# Patient Record
Sex: Female | Born: 1985 | Race: Black or African American | Hispanic: No | Marital: Married | Smoking: Never smoker
Health system: Southern US, Community
[De-identification: ages and names within clinical notes are randomized; demographics above are authoritative.]

## PROBLEM LIST (undated history)

## (undated) ENCOUNTER — Inpatient Hospital Stay: Payer: Self-pay

## (undated) DIAGNOSIS — K219 Gastro-esophageal reflux disease without esophagitis: Secondary | ICD-10-CM

## (undated) DIAGNOSIS — R1310 Dysphagia, unspecified: Secondary | ICD-10-CM

## (undated) DIAGNOSIS — D649 Anemia, unspecified: Secondary | ICD-10-CM

## (undated) DIAGNOSIS — M1990 Unspecified osteoarthritis, unspecified site: Secondary | ICD-10-CM

## (undated) DIAGNOSIS — R269 Unspecified abnormalities of gait and mobility: Secondary | ICD-10-CM

## (undated) DIAGNOSIS — G4730 Sleep apnea, unspecified: Secondary | ICD-10-CM

## (undated) DIAGNOSIS — Q6589 Other specified congenital deformities of hip: Secondary | ICD-10-CM

## (undated) DIAGNOSIS — R51 Headache: Secondary | ICD-10-CM

## (undated) DIAGNOSIS — J45909 Unspecified asthma, uncomplicated: Secondary | ICD-10-CM

## (undated) DIAGNOSIS — F329 Major depressive disorder, single episode, unspecified: Secondary | ICD-10-CM

## (undated) DIAGNOSIS — F4310 Post-traumatic stress disorder, unspecified: Secondary | ICD-10-CM

## (undated) DIAGNOSIS — E039 Hypothyroidism, unspecified: Secondary | ICD-10-CM

## (undated) DIAGNOSIS — J309 Allergic rhinitis, unspecified: Secondary | ICD-10-CM

## (undated) DIAGNOSIS — G809 Cerebral palsy, unspecified: Secondary | ICD-10-CM

## (undated) DIAGNOSIS — F32A Depression, unspecified: Secondary | ICD-10-CM

## (undated) HISTORY — DX: Unspecified osteoarthritis, unspecified site: M19.90

## (undated) HISTORY — DX: Other specified congenital deformities of hip: Q65.89

## (undated) HISTORY — DX: Allergic rhinitis, unspecified: J30.9

## (undated) HISTORY — DX: Dysphagia, unspecified: R13.10

## (undated) HISTORY — PX: HERNIA REPAIR: SHX51

## (undated) HISTORY — PX: FOOT SURGERY: SHX648

## (undated) HISTORY — PX: TONSILLECTOMY: SUR1361

## (undated) HISTORY — PX: JOINT REPLACEMENT: SHX530

## (undated) HISTORY — DX: Unspecified abnormalities of gait and mobility: R26.9

## (undated) HISTORY — DX: Depression, unspecified: F32.A

---
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
## 2010-04-29 ENCOUNTER — Ambulatory Visit: Payer: Self-pay | Admitting: Physician Assistant

## 2010-05-09 ENCOUNTER — Encounter: Payer: Self-pay | Admitting: Otolaryngology

## 2010-05-27 ENCOUNTER — Encounter: Payer: Self-pay | Admitting: Otolaryngology

## 2010-12-02 ENCOUNTER — Ambulatory Visit: Payer: Self-pay | Admitting: Gastroenterology

## 2010-12-02 HISTORY — PX: UPPER GI ENDOSCOPY: SHX6162

## 2012-06-30 ENCOUNTER — Ambulatory Visit: Payer: Self-pay | Admitting: Internal Medicine

## 2013-03-22 ENCOUNTER — Emergency Department: Payer: Self-pay | Admitting: Emergency Medicine

## 2013-03-31 ENCOUNTER — Ambulatory Visit: Payer: Self-pay | Admitting: Orthopedic Surgery

## 2013-04-12 ENCOUNTER — Ambulatory Visit: Payer: Self-pay | Admitting: Orthopedic Surgery

## 2013-04-12 LAB — PREGNANCY, URINE: Pregnancy Test, Urine: NEGATIVE m[IU]/mL

## 2013-04-16 LAB — BODY FLUID CULTURE

## 2013-05-29 ENCOUNTER — Ambulatory Visit: Payer: Self-pay | Admitting: Orthopedic Surgery

## 2013-07-31 ENCOUNTER — Ambulatory Visit: Payer: Self-pay | Admitting: Otolaryngology

## 2013-09-08 ENCOUNTER — Other Ambulatory Visit (HOSPITAL_COMMUNITY): Payer: Self-pay | Admitting: Orthopedic Surgery

## 2013-09-08 DIAGNOSIS — M25559 Pain in unspecified hip: Secondary | ICD-10-CM

## 2013-09-16 ENCOUNTER — Emergency Department (HOSPITAL_COMMUNITY): Payer: Medicaid Other

## 2013-09-16 ENCOUNTER — Emergency Department (HOSPITAL_COMMUNITY)
Admission: EM | Admit: 2013-09-16 | Discharge: 2013-09-16 | Disposition: A | Discharge status: Home / Self Care | Payer: Medicaid Other | Attending: Emergency Medicine | Admitting: Emergency Medicine

## 2013-09-16 ENCOUNTER — Encounter (HOSPITAL_COMMUNITY): Payer: Self-pay | Admitting: Emergency Medicine

## 2013-09-16 DIAGNOSIS — J45909 Unspecified asthma, uncomplicated: Secondary | ICD-10-CM | POA: Insufficient documentation

## 2013-09-16 DIAGNOSIS — G8929 Other chronic pain: Secondary | ICD-10-CM | POA: Insufficient documentation

## 2013-09-16 DIAGNOSIS — M25559 Pain in unspecified hip: Secondary | ICD-10-CM | POA: Insufficient documentation

## 2013-09-16 DIAGNOSIS — Z8669 Personal history of other diseases of the nervous system and sense organs: Secondary | ICD-10-CM | POA: Insufficient documentation

## 2013-09-16 DIAGNOSIS — R1310 Dysphagia, unspecified: Secondary | ICD-10-CM | POA: Insufficient documentation

## 2013-09-16 HISTORY — DX: Unspecified asthma, uncomplicated: J45.909

## 2013-09-16 HISTORY — DX: Cerebral palsy, unspecified: G80.9

## 2013-09-16 LAB — COMPREHENSIVE METABOLIC PANEL
ALK PHOS: 119 U/L — AB (ref 39–117)
ALT: 13 U/L (ref 0–35)
AST: 16 U/L (ref 0–37)
Albumin: 3.5 g/dL (ref 3.5–5.2)
BUN: 7 mg/dL (ref 6–23)
CO2: 23 meq/L (ref 19–32)
Calcium: 9.2 mg/dL (ref 8.4–10.5)
Chloride: 103 mEq/L (ref 96–112)
Creatinine, Ser: 0.68 mg/dL (ref 0.50–1.10)
GLUCOSE: 78 mg/dL (ref 70–99)
POTASSIUM: 3.7 meq/L (ref 3.7–5.3)
Sodium: 138 mEq/L (ref 137–147)
TOTAL PROTEIN: 7.3 g/dL (ref 6.0–8.3)
Total Bilirubin: 0.3 mg/dL (ref 0.3–1.2)

## 2013-09-16 LAB — CBC
HEMATOCRIT: 35.5 % — AB (ref 36.0–46.0)
Hemoglobin: 11.7 g/dL — ABNORMAL LOW (ref 12.0–15.0)
MCH: 27.7 pg (ref 26.0–34.0)
MCHC: 33 g/dL (ref 30.0–36.0)
MCV: 83.9 fL (ref 78.0–100.0)
Platelets: 264 10*3/uL (ref 150–400)
RBC: 4.23 MIL/uL (ref 3.87–5.11)
RDW: 12.5 % (ref 11.5–15.5)
WBC: 7.1 10*3/uL (ref 4.0–10.5)

## 2013-09-16 NOTE — ED Notes (Signed)
Phlebotomy unable to obtain labs, two phlebotomist attempted

## 2013-09-16 NOTE — Discharge Instructions (Signed)
Follow up with your primary care doctor early this coming week - call office Monday morning to arrange appointment - discuss plan for possible specialist referral.  You may also ask your doctor to recheck your thyroid function. For chronic hip pain, follow up with orthopedist in the next couple weeks.  Given recurrent issues w voice, and swallowing, follow up with neurologist in the next couple weeks-  Call office to arrange appointment. Return to ER if worse, new symptoms, persistent vomiting, trouble breathing, other concern.     Dysphagia Swallowing problems (dysphagia) occur when solids and liquids seem to stick in your throat on the way down to your stomach, or the food takes longer to get to the stomach. Other symptoms include regurgitating food, noises coming from the throat, chest discomfort with swallowing, and a feeling of fullness or the feeling of something being stuck in your throat when swallowing. When blockage in your throat is complete it may be associated with drooling. CAUSES  Problems with swallowing may occur because of problems with the muscles. The food cannot be propelled in the usual manner into your stomach. You may have ulcers, scar tissue, or inflammation in the tube down which food travels from your mouth to your stomach (esophagus), which blocks food from passing normally into the stomach. Causes of inflammation include:  Acid reflux from your stomach into your esophagus.  Infection.  Radiation treatment for cancer.  Medicines taken without enough fluids to wash them down into your stomach. You may have nerve problems that prevent signals from being sent to the muscles of your esophagus to contract and move your food down to your stomach. Globus pharyngeus is a relatively common problem in which there is a sense of an obstruction or difficulty in swallowing, without any physical abnormalities of the swallowing passages being found. This problem usually improves over  time with reassurance and testing to rule out other causes. DIAGNOSIS Dysphagia can be diagnosed and its cause can be determined by tests in which you swallow a white substance that helps illuminate the inside of your throat (contrast medium) while X-rays are taken. Sometimes a flexible telescope that is inserted down your throat (endoscopy) to look at your esophagus and stomach is used. TREATMENT   If the dysphagia is caused by acid reflux or infection, medicines may be used.  If the dysphagia is caused by problems with your swallowing muscles, swallowing therapy may be used to help you strengthen your swallowing muscles.  If the dysphagia is caused by a blockage or mass, procedures to remove the blockage may be done. HOME CARE INSTRUCTIONS  Try to eat soft food that is easier to swallow and check your weight on a daily basis to be sure that it is not decreasing.  Be sure to drink liquids when sitting upright (not lying down). SEEK MEDICAL CARE IF:  You are losing weight because you are unable to swallow.  You are coughing when you drink liquids (aspiration).  You are coughing up partially digested food. SEEK IMMEDIATE MEDICAL CARE IF:  You are unable to swallow your own saliva .  You are having shortness of breath or a fever, or both.  You have a hoarse voice along with difficulty swallowing. MAKE SURE YOU:  Understand these instructions.  Will watch your condition.  Will get help right away if you are not doing well or get worse. Document Released: 07/10/2000 Document Revised: 03/15/2013 Document Reviewed: 12/30/2012 [facility] Patient Information 2014 [place].

## 2013-09-16 NOTE — ED Provider Notes (Signed)
CSN: [id number]     Arrival date & time 09/16/13  1230 History   First MD Initiated Contact with Patient 09/16/13 1259     Chief Complaint  Patient presents with  . Dysphagia     (Consider location/radiation/quality/duration/timing/severity/associated sxs/prior Treatment) The history is provided by the patient and a friend.  pt indicates for the past several months has had issues w trouble swallowing. She is able to eat/drink, but states is hard to swallow.  No acute fb sensation today, but swallowing difficulty persists. States has seen pcp and gi for same without specific dx, had egd which was told was normal. Pt notes prior to that was having 'vocal cord problems' for several months, states was sent to specialist by pcp, but again not specific dx.  Denies facial or extremity numbness/weakness. No problems w gait (other than due to chronic hip pain x months/yrs, s/p hip surgery approx 10 yrs ago). Denies choking, or trouble breathing.   Pt states w above symptoms, no definite dx, states one of her doctors told her she may have CP.   Past Medical History  Diagnosis Date  . Asthma   . CP (cerebral palsy)    History reviewed. No pertinent past surgical history. History reviewed. No pertinent family history. History  Substance Use Topics  . Smoking status: Not on file  . Smokeless tobacco: Not on file  . Alcohol Use: No   OB History   Grav Para Term Preterm Abortions TAB SAB Ect Mult Living                 Review of Systems  Constitutional: Negative for fever and chills.  HENT: Negative for sore throat.   Eyes: Negative for redness.  Respiratory: Negative for choking and shortness of breath.   Cardiovascular: Negative for chest pain.  Gastrointestinal: Negative for vomiting, abdominal pain and diarrhea.  Genitourinary: Negative for flank pain.  Musculoskeletal: Negative for back pain and neck pain.  Skin: Negative for rash.  Neurological: Negative for headaches.   Hematological: Does not bruise/bleed easily.  Psychiatric/Behavioral: Negative for confusion.      Allergies  Review of patient's allergies indicates no known allergies.  Home Medications  No current outpatient prescriptions on file. BP 124/83  Pulse 112  SpO2 100% Physical Exam  Nursing note and vitals reviewed. Constitutional: She is oriented to person, place, and time. She appears well-developed and well-nourished. No distress.  Speech quiet - states baseline for pt.   HENT:  Head: Atraumatic.  Mouth/Throat: Oropharynx is clear and moist.  Eyes: Conjunctivae are normal. No scleral icterus.  Neck: Normal range of motion. Neck supple. No tracheal deviation present. No thyromegaly present.  No stiffness/rigidity. No mass.   Cardiovascular: Normal rate, regular rhythm, normal heart sounds and intact distal pulses.   Pulmonary/Chest: Effort normal and breath sounds normal. No respiratory distress.  Abdominal: Soft. Normal appearance and bowel sounds are normal. She exhibits no distension and no mass. There is no tenderness. There is no rebound and no guarding.  Musculoskeletal: She exhibits no edema.  Neurological: She is alert and oriented to person, place, and time.  Motor intact bil.   Skin: Skin is warm and dry. No rash noted.    ED Course  Procedures (including critical care time)  Results for orders placed during the hospital encounter of 09/16/13  CBC      Result Value Ref Range   WBC 7.1  4.0 - 10.5 K/uL   RBC 4.23  3.87 -  5.11 MIL/uL   Hemoglobin 11.7 (*) 12.0 - 15.0 g/dL   HCT 35.5 (*) 36.0 - 46.0 %   MCV 83.9  78.0 - 100.0 fL   MCH 27.7  26.0 - 34.0 pg   MCHC 33.0  30.0 - 36.0 g/dL   RDW 12.5  11.5 - 15.5 %   Platelets 264  150 - 400 K/uL  COMPREHENSIVE METABOLIC PANEL      Result Value Ref Range   Sodium 138  137 - 147 mEq/L   Potassium 3.7  3.7 - 5.3 mEq/L   Chloride 103  96 - 112 mEq/L   CO2 23  19 - 32 mEq/L   Glucose, Bld 78  70 - 99 mg/dL   BUN  7  6 - 23 mg/dL   Creatinine, Ser 0.68  0.50 - 1.10 mg/dL   Calcium 9.2  8.4 - 10.5 mg/dL   Total Protein 7.3  6.0 - 8.3 g/dL   Albumin 3.5  3.5 - 5.2 g/dL   AST 16  0 - 37 U/L   ALT 13  0 - 35 U/L   Alkaline Phosphatase 119 (*) 39 - 117 U/L   Total Bilirubin 0.3  0.3 - 1.2 mg/dL   GFR calc non Af Amer >90  >90 mL/min   GFR calc Af Amer >90  >90 mL/min   Dg Esophagus  09/16/2013   CLINICAL DATA:  Dysphagia.  Food sticking in the chest.  EXAM: ESOPHOGRAM  TECHNIQUE: Single contrast examination was performed using thin barium.  COMPARISON:  None.  FLUOROSCOPY TIME:  1 min 31 seconds  FINDINGS: Exam was somewhat limited by patient's cerebral palsy, with associated mental and physical limitations. No evidence of esophageal mass, stricture, or obstruction. No foreign body seen within the esophagus. No hiatal hernia identified. Esophageal motility is within normal limits. No gastroesophageal reflux visualized.  IMPRESSION: Negative.   Electronically Signed   By: Earle Gell M.D.   On: 09/16/2013 14:57      MDM   Labs. Trial po fluids.  Pt/sig other note prior workups for same at [facility] - sent for records.  Reviewed nursing notes and prior charts for additional history.   Unable to obtain egd report. Pt/signif other states no prior records at [facility], only at [facility].  [facility] records obtained, no egd, report of prior workup hip pain from 2014 neg for acute infxn/acute process.   esophagus study in ed negative.   Pt is eating/drinking in ED.  States pcp is Dr Lisbeth Ply - encourage to follow up with her early this week and coordinate specialty care follow up/plan.  Given hx 'vocal cord problems, and now prolonged dysphagia, will refer to neuro follow up as well.  Pt appears stable for d/c.     Mirna Mires, MD 09/16/13 1535

## 2013-09-16 NOTE — ED Notes (Signed)
Pt in from home via [facility], c/o difficulty swallowing onset x 1 mth, pt has had swallow study completed, hx CP, pt communicates with difficulty verbally per EMS this is baseline, pt lives independently, pt A&O x4, follows commands, speaks in complete sentence, uses walker at home, no excessive drooling present

## 2013-09-26 ENCOUNTER — Ambulatory Visit (HOSPITAL_COMMUNITY)
Admission: RE | Admit: 2013-09-26 | Discharge: 2013-09-26 | Disposition: A | Discharge status: Home / Self Care | Payer: Medicaid Other | Source: Ambulatory Visit | Attending: Orthopedic Surgery | Admitting: Orthopedic Surgery

## 2013-09-26 ENCOUNTER — Ambulatory Visit: Payer: Self-pay | Admitting: Family Medicine

## 2013-09-26 DIAGNOSIS — M25559 Pain in unspecified hip: Secondary | ICD-10-CM | POA: Insufficient documentation

## 2013-09-26 DIAGNOSIS — Z96649 Presence of unspecified artificial hip joint: Secondary | ICD-10-CM | POA: Insufficient documentation

## 2013-10-03 ENCOUNTER — Encounter: Payer: Self-pay | Admitting: Neurology

## 2013-10-03 ENCOUNTER — Ambulatory Visit (INDEPENDENT_AMBULATORY_CARE_PROVIDER_SITE_OTHER): Payer: Medicaid Other | Admitting: Neurology

## 2013-10-03 VITALS — BP 124/79 | HR 86 | Ht 67.0 in | Wt 195.0 lb

## 2013-10-03 DIAGNOSIS — M25559 Pain in unspecified hip: Secondary | ICD-10-CM

## 2013-10-03 DIAGNOSIS — M25552 Pain in left hip: Secondary | ICD-10-CM

## 2013-10-03 DIAGNOSIS — R1310 Dysphagia, unspecified: Secondary | ICD-10-CM | POA: Insufficient documentation

## 2013-10-03 DIAGNOSIS — G808 Other cerebral palsy: Secondary | ICD-10-CM

## 2013-10-03 DIAGNOSIS — R269 Unspecified abnormalities of gait and mobility: Secondary | ICD-10-CM | POA: Insufficient documentation

## 2013-10-03 NOTE — Progress Notes (Signed)
[facility] [address] [place]. [place] [address] [phone]       OFFICE CONSULT NOTE  Ms. Pamela Palmer Date of Birth:  [birth year] Medical Record Number:  [id number]   Referring MD:  Daiva Eves  Reason for Referral:  dysphagia  HPI: 28 year old African American lady with dysphagia since last 6 months. History is obtained from the patient and referral notes. She states that this is gradual onset and not progressive. She is able to initiate swallowing but feels that food gets stuck in her throat. She has had to chew food into small pieces in order to keep. She can eat soft foods only. She has noted decreased appetite and she's lost 10 pounds for the last 6 months. She also states that her voice seems to fluctuate at times it is deep and it is soft. She has notice increasing dysphagia particularly at night with occasional sharp and burning pain in her throat. She states she's had swallow eval done twice but review of records did not reveal any reports. On inquiry she admits to getting tired easily and having less stamina. She denies accompanying neurological symptoms in the form of droopy eyelids, diurnal fluctuation in her symptoms, muscle fatigue, diplopia, jaw dropping while eating or extremity weakness. She was seen in [facility] emergency room recently and had CBC and BMP which were normal. 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 She uses a walker for long distances and can walk short distances without help. She vent is present special needs classes and finished high school. She is on disability. 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 She denies any worsening of her baseline gait difficulties or speech problems along with thi dysphagia over the last 6 months.  ROS:   14 system review of systems is positive for weight loss,  chest pain,ringing in ears,trouble swallowing,itching,blurred vision,feeling cold, joint pain,aching muscles, difficulty swallowing,insonmia, snoring,not enough sleep,decreased energy, change in apetite  PMH:  Past Medical History  Diagnosis Date  . Asthma   . CP (cerebral palsy)   . Dysphagia   . Abnormality of gait     Social History:  History   Social History  . Marital Status: Significant Other    Spouse Name: N/A    Number of Children: 0  . Years of Education: 12th   Occupational History  . na    Social History Main Topics  . Smoking status: Never Smoker   . Smokeless tobacco: Not on file  . Alcohol Use: No  . Drug Use: No  . Sexual Activity: Not on file   Other Topics Concern  . Not on file   Social History Narrative  . No narrative on file    Medications:   Current Outpatient Prescriptions on File Prior to Visit  Medication Sig Dispense Refill  . albuterol (PROVENTIL HFA;VENTOLIN HFA) 108 (90 BASE) MCG/ACT inhaler Inhale 2 puffs into the lungs every 6 (six) hours as needed for wheezing or shortness of breath.      . cetirizine (ZYRTEC) 10 MG tablet Take 10 mg by mouth daily.      Marland Kitchen dexlansoprazole (DEXILANT) 60 MG capsule Take 60 mg by mouth daily.      Marland Kitchen levothyroxine (SYNTHROID, LEVOTHROID) 50 MCG tablet Take 50 mcg by mouth daily before breakfast.      . montelukast (SINGULAIR) 10  MG tablet Take 10 mg by mouth daily.      . nabumetone (RELAFEN) 500 MG tablet Take 500 mg by mouth 2 (two) times daily.      Marland Kitchen omeprazole (PRILOSEC) 40 MG capsule Take 40 mg by mouth daily.      Marland Kitchen zolpidem (AMBIEN) 5 MG tablet Take 5 mg by mouth at bedtime as needed for sleep.       No current facility-administered medications on file prior to visit.    Allergies:  No Known Allergies  Physical Exam General: well developed, well nourished, seated, in no evident distress Head: head normocephalic and atraumatic. Orohparynx benign Neck: supple with no carotid or  supraclavicular bruits Cardiovascular: regular rate and rhythm, no murmurs Musculoskeletal: no deformity Skin:  no rash/petichiae Vascular:  Normal pulses all extremities Filed Vitals:   10/03/13 1255  BP: 124/79  Pulse: 86    Neurologic Exam Mental Status: Awake and fully alert. Oriented to place and time. Recent and remote memory intact. Attention span, concentration and fund of knowledge appropriate. Mood and affect appropriate.  Cranial Nerves: Fundoscopic exam reveals sharp disc margins. Pupils equal, briskly reactive to light. Extraocular movements full without nystagmus.Mild drooping of both eyelids left more than right Visual fields full to confrontation. Hearing intact. Facial sensation intact. Face, tongue, palate moves normally and symmetrically.  Motor: Normal bulk and tone. Normal strength in all tested extremity muscles. Except mild left hip weakness due to pain Sensory.: intact to touch and pinprick and vibratory.  Coordination: Rapid alternating movements normal in all extremities. Finger-to-nose and heel-to-shin performed accurately bilaterally. Gait and Station: Arises from chair with  difficulty. Stance is normal. Gait is antalgic with favoring left hip Reflexes:3++ and asymmetric left more than right. Toes downgoing.     ASSESSMENT: 28 year african [place] lady with  Isolated subacute dysphagia of unclear etiology-possibilities  To be considered include bulbar myasthenia versus demyelinating disease or brainstem structural lesion.Marland Kitchen psychogenic dysphagia is a possibility after above organic causes have been ruled out    PLAN: I had a long discussion the patient with regards to dysphagia and long-standing gait abnormality, onset questions and discussed plan for evaluation. I recommend checking brain MRI scan and cervical spine rule out structural lesions as well as checking acetylcholine receptor antibodies and EMG for myasthenia. Also check ANA, ESR, B12 and TSH. Refer  to speech therapy for this pager evaluation. Return for followup in 2 months or call earlier if necessary.   Note: This document was prepared with digital dictation and possible smart phrase technology. Any transcriptional errors that result from this process are unintentional.

## 2013-10-03 NOTE — Patient Instructions (Signed)
I had a long discussion the patient with regards to dysphagia and long-standing gait abnormality, onset questions and discussed plan for evaluation. I recommend checking brain MRI scan and cervical spine rule out structural lesions as well as checking acetylcholine receptor antibodies and EMG for myasthenia. Also check ANA, ESR, B12 and TSH. Refer to speech therapy for this pager evaluation. Return for followup in 2 months or call earlier if necessary.  Dysphagia Swallowing problems (dysphagia) occur when solids and liquids seem to stick in your throat on the way down to your stomach, or the food takes longer to get to the stomach. Other symptoms include regurgitating food, noises coming from the throat, chest discomfort with swallowing, and a feeling of fullness or the feeling of something being stuck in your throat when swallowing. When blockage in your throat is complete it may be associated with drooling. CAUSES  Problems with swallowing may occur because of problems with the muscles. The food cannot be propelled in the usual manner into your stomach. You may have ulcers, scar tissue, or inflammation in the tube down which food travels from your mouth to your stomach (esophagus), which blocks food from passing normally into the stomach. Causes of inflammation include:  Acid reflux from your stomach into your esophagus.  Infection.  Radiation treatment for cancer.  Medicines taken without enough fluids to wash them down into your stomach. You may have nerve problems that prevent signals from being sent to the muscles of your esophagus to contract and move your food down to your stomach. Globus pharyngeus is a relatively common problem in which there is a sense of an obstruction or difficulty in swallowing, without any physical abnormalities of the swallowing passages being found. This problem usually improves over time with reassurance and testing to rule out other causes. DIAGNOSIS Dysphagia can  be diagnosed and its cause can be determined by tests in which you swallow a white substance that helps illuminate the inside of your throat (contrast medium) while X-rays are taken. Sometimes a flexible telescope that is inserted down your throat (endoscopy) to look at your esophagus and stomach is used. TREATMENT   If the dysphagia is caused by acid reflux or infection, medicines may be used.  If the dysphagia is caused by problems with your swallowing muscles, swallowing therapy may be used to help you strengthen your swallowing muscles.  If the dysphagia is caused by a blockage or mass, procedures to remove the blockage may be done. HOME CARE INSTRUCTIONS  Try to eat soft food that is easier to swallow and check your weight on a daily basis to be sure that it is not decreasing.  Be sure to drink liquids when sitting upright (not lying down). SEEK MEDICAL CARE IF:  You are losing weight because you are unable to swallow.  You are coughing when you drink liquids (aspiration).  You are coughing up partially digested food. SEEK IMMEDIATE MEDICAL CARE IF:  You are unable to swallow your own saliva .  You are having shortness of breath or a fever, or both.  You have a hoarse voice along with difficulty swallowing. MAKE SURE YOU:  Understand these instructions.  Will watch your condition.  Will get help right away if you are not doing well or get worse. Document Released: 07/10/2000 Document Revised: 03/15/2013 Document Reviewed: 12/30/2012 [facility] Patient Information 2014 [place].

## 2013-10-06 LAB — THYROID PANEL WITH TSH
Free Thyroxine Index: 3.3 (ref 1.2–4.9)
T3 UPTAKE RATIO: 27 % (ref 24–39)
T4 TOTAL: 12.3 ug/dL — AB (ref 4.5–12.0)
TSH: 0.567 u[IU]/mL (ref 0.450–4.500)

## 2013-10-06 LAB — ACETYLCHOLINE RECEPTOR AB, ALL
AChR Binding Ab, Serum: <0.03 nmol/L (ref 0.00–0.24)
AChR Blocking Abs, Serum: 29 % — ABNORMAL HIGH (ref 0–25)
Acetylcholine Rec Mod Ab: <12 % (ref 0–20)

## 2013-10-06 LAB — VITAMIN B12: Vitamin B-12: 603 pg/mL (ref 211–946)

## 2013-10-06 LAB — ANA: ANA: POSITIVE — AB

## 2013-10-06 LAB — SEDIMENTATION RATE: SED RATE: 6 mm/h (ref 0–32)

## 2013-10-13 ENCOUNTER — Ambulatory Visit (INDEPENDENT_AMBULATORY_CARE_PROVIDER_SITE_OTHER): Payer: Medicaid Other | Admitting: Neurology

## 2013-10-13 ENCOUNTER — Encounter (INDEPENDENT_AMBULATORY_CARE_PROVIDER_SITE_OTHER): Payer: Medicaid Other

## 2013-10-13 DIAGNOSIS — R269 Unspecified abnormalities of gait and mobility: Secondary | ICD-10-CM

## 2013-10-13 DIAGNOSIS — G20 Parkinson's disease: Secondary | ICD-10-CM | POA: Insufficient documentation

## 2013-10-13 DIAGNOSIS — M6281 Muscle weakness (generalized): Secondary | ICD-10-CM

## 2013-10-13 DIAGNOSIS — R1310 Dysphagia, unspecified: Secondary | ICD-10-CM

## 2013-10-13 DIAGNOSIS — Z0289 Encounter for other administrative examinations: Secondary | ICD-10-CM

## 2013-10-13 NOTE — Procedures (Signed)
HISTORY: Pamela Palmer is a 28 year old patient with a history of generalized weakness and problems with swallowing over the last several years, gradually worsened over time. The patient is being evaluated for possible neuromuscular etiology of her weakness and swallowing problems.  NERVE CONDUCTION STUDIES:  Nerve conduction studies were performed on the right upper extremity. The distal motor latencies and motor amplitudes for the median and ulnar nerves were within normal limits. The F wave latencies and nerve conduction velocities for these nerves were also normal. The sensory latencies for the median and ulnar nerves were normal.  Nerve conduction studies were performed on right lower extremity. The distal motor latencies and motor amplitudes for the peroneal and posterior tibial nerves were within normal limits. The nerve conduction velocities for these nerves were also normal.The sensory latency for the peroneal nerve was within normal limits.  A repetitive nerve stimulation study was performed on the right upper extremity with a pickup on the right APB muscle. This muscle was fatigued for 1 minute, and 3 Hz stimulation with trains of 10 stimulations were given at 15 seconds, 1 minute, 2 minutes, 3 minutes, 4 minutes, and 5 minutes following fatigue. No evidence of an incremental or decremental response was seen. Baseline was stable.   EMG STUDIES:  EMG study was performed on the left upper extremity:  The first dorsal interosseous muscle reveals 2 to 4 K units with full recruitment. No fibrillations or positive waves were noted. The abductor pollicis brevis muscle reveals 2 to 4 K units with full recruitment. No fibrillations or positive waves were noted. The extensor indicis proprius muscle reveals 1 to 3 K units with full recruitment. No fibrillations or positive waves were noted. The pronator teres muscle reveals 2 to 3 K units with full recruitment. No fibrillations or  positive waves were noted. The biceps muscle reveals 1 to 2 K units with full recruitment. No fibrillations or positive waves were noted. The triceps muscle reveals 2 to 4 K units with full recruitment. No fibrillations or positive waves were noted. The anterior deltoid muscle reveals 2 to 3 K units with full recruitment. No fibrillations or positive waves were noted. The cervical paraspinal muscles were tested at 2 levels. No abnormalities of insertional activity were seen at either level tested. There was good relaxation.  EMG study was performed on the left lower extremity:  The tibialis anterior muscle reveals 2 to 4K motor units with full recruitment. No fibrillations or positive waves were seen. The peroneus tertius muscle reveals 2 to 4K motor units with full recruitment. No fibrillations or positive waves were seen. The medial gastrocnemius muscle reveals 1 to 3K motor units with full recruitment. No fibrillations or positive waves were seen. The vastus lateralis muscle reveals 2 to 4K motor units with full recruitment. No fibrillations or positive waves were seen. The iliopsoas muscle reveals 2 to 4K motor units with full recruitment. No fibrillations or positive waves were seen. The biceps femoris muscle (long head) reveals 2 to 4K motor units with full recruitment. No fibrillations or positive waves were seen. The lumbosacral paraspinal muscles were tested at 3 levels, and revealed no abnormalities of insertional activity at all 3 levels tested. There was good relaxation.   IMPRESSION:  Nerve conduction studies done on the right upper and right lower extremities were unremarkable, without evidence of a peripheral neuropathy. EMG evaluation of the left upper and left lower extremities were unremarkable, without evidence of a cervical or a lumbosacral radiculopathy  on that side. The repetitive nerve stimulation done on the right upper extremity was unremarkable, without clear evidence of  a neuromuscular transmission disorder seen on this evaluation.  Jill Alexanders MD 10/13/2013 2:37 PM  [facility] [address] [place], [place] [address]  Phone [phone] Fax [phone]

## 2013-10-14 ENCOUNTER — Other Ambulatory Visit: Payer: Self-pay | Admitting: Neurology

## 2013-10-14 ENCOUNTER — Ambulatory Visit
Admission: RE | Admit: 2013-10-14 | Discharge: 2013-10-14 | Disposition: A | Discharge status: Home / Self Care | Payer: Medicaid Other | Source: Ambulatory Visit | Attending: Neurology | Admitting: Neurology

## 2013-10-14 DIAGNOSIS — M25552 Pain in left hip: Secondary | ICD-10-CM

## 2013-10-14 DIAGNOSIS — R269 Unspecified abnormalities of gait and mobility: Secondary | ICD-10-CM

## 2013-10-14 DIAGNOSIS — R1310 Dysphagia, unspecified: Secondary | ICD-10-CM

## 2013-11-20 ENCOUNTER — Other Ambulatory Visit: Payer: Self-pay | Admitting: Orthopedic Surgery

## 2013-12-02 NOTE — Pre-Procedure Instructions (Signed)
- Preparing for Surgery  Before surgery, you can play an important role.  Because skin is not sterile, your skin needs to be as free of germs as possible.  You can reduce the number of germs on you skin by washing with CHG (chlorahexidine gluconate) soap before surgery.  CHG is an antiseptic cleaner which kills germs and bonds with the skin to continue killing germs even after washing.  Please DO NOT use if you have an allergy to CHG or antibacterial soaps.  If your skin becomes reddened/irritated stop using the CHG and inform your nurse when you arrive at Short Stay.  Do not shave (including legs and underarms) for at least 48 hours prior to the first CHG shower.  You may shave your face.  Please follow these instructions carefully:   1.  Shower with CHG Soap the night before surgery and the morning of Surgery.  2.  If you choose to wash your hair, wash your hair first as usual with your normal shampoo.  3.  After you shampoo, rinse your hair and body thoroughly to remove the shampoo.  4.  Use CHG as you would any other liquid soap.  You can apply CHG directly to the skin and wash gently with scrungie or a clean washcloth.  5.  Apply the CHG Soap to your body ONLY FROM THE NECK DOWN.  Do not use on open wounds or open sores.  Avoid contact with your eyes, ears, mouth and genitals (private parts).  Wash genitals (private parts) with your normal soap.  6.  Wash thoroughly, paying special attention to the area where your surgery will be performed.  7.  Thoroughly rinse your body with warm water from the neck down.  8.  DO NOT shower/wash with your normal soap after using and rinsing off the CHG Soap.  9.  Pat yourself dry with a clean towel.            10.  Wear clean pajamas.            11.  Place clean sheets on your bed the night of your first shower and do not sleep with pets.  Day of Surgery  Do not apply any lotions the morning of surgery.  Please wear clean clothes to the  hospital/surgery center.   

## 2013-12-04 ENCOUNTER — Encounter (HOSPITAL_COMMUNITY)
Admission: RE | Admit: 2013-12-04 | Discharge: 2013-12-04 | Disposition: A | Discharge status: Home / Self Care | Payer: Medicaid Other | Source: Ambulatory Visit | Attending: Orthopedic Surgery | Admitting: Orthopedic Surgery

## 2013-12-04 ENCOUNTER — Encounter (HOSPITAL_COMMUNITY): Payer: Self-pay | Admitting: Pharmacy Technician

## 2013-12-04 ENCOUNTER — Encounter (HOSPITAL_COMMUNITY): Payer: Self-pay

## 2013-12-04 DIAGNOSIS — Z01818 Encounter for other preprocedural examination: Secondary | ICD-10-CM | POA: Insufficient documentation

## 2013-12-04 DIAGNOSIS — Z01810 Encounter for preprocedural cardiovascular examination: Secondary | ICD-10-CM | POA: Insufficient documentation

## 2013-12-04 DIAGNOSIS — Z01812 Encounter for preprocedural laboratory examination: Secondary | ICD-10-CM | POA: Insufficient documentation

## 2013-12-04 HISTORY — DX: Headache: R51

## 2013-12-04 HISTORY — DX: Gastro-esophageal reflux disease without esophagitis: K21.9

## 2013-12-04 HISTORY — DX: Unspecified osteoarthritis, unspecified site: M19.90

## 2013-12-04 HISTORY — DX: Sleep apnea, unspecified: G47.30

## 2013-12-04 HISTORY — DX: Post-traumatic stress disorder, unspecified: F43.10

## 2013-12-04 HISTORY — DX: Hypothyroidism, unspecified: E03.9

## 2013-12-04 LAB — BASIC METABOLIC PANEL
BUN: 9 mg/dL (ref 6–23)
CALCIUM: 9 mg/dL (ref 8.4–10.5)
CO2: 23 mEq/L (ref 19–32)
Chloride: 106 mEq/L (ref 96–112)
Creatinine, Ser: 0.66 mg/dL (ref 0.50–1.10)
GFR calc Af Amer: >90 mL/min (ref >90)
Glucose, Bld: 82 mg/dL (ref 70–99)
Potassium: 3.5 mEq/L — ABNORMAL LOW (ref 3.7–5.3)
SODIUM: 142 meq/L (ref 137–147)

## 2013-12-04 LAB — URINALYSIS, ROUTINE W REFLEX MICROSCOPIC
BILIRUBIN URINE: NEGATIVE
GLUCOSE, UA: NEGATIVE mg/dL
HGB URINE DIPSTICK: NEGATIVE
Ketones, ur: NEGATIVE mg/dL
Leukocytes, UA: NEGATIVE
Nitrite: NEGATIVE
Protein, ur: NEGATIVE mg/dL
SPECIFIC GRAVITY, URINE: 1.022 (ref 1.005–1.030)
Urobilinogen, UA: 1 mg/dL (ref 0.0–1.0)
pH: 7.5 (ref 5.0–8.0)

## 2013-12-04 LAB — CBC WITH DIFFERENTIAL/PLATELET
Basophils Absolute: 0 10*3/uL (ref 0.0–0.1)
Basophils Relative: 0 % (ref 0–1)
EOS ABS: 0.1 10*3/uL (ref 0.0–0.7)
Eosinophils Relative: 1 % (ref 0–5)
HCT: 34.7 % — ABNORMAL LOW (ref 36.0–46.0)
Hemoglobin: 10.9 g/dL — ABNORMAL LOW (ref 12.0–15.0)
LYMPHS PCT: 30 % (ref 12–46)
Lymphs Abs: 1.9 10*3/uL (ref 0.7–4.0)
MCH: 27.1 pg (ref 26.0–34.0)
MCHC: 31.4 g/dL (ref 30.0–36.0)
MCV: 86.3 fL (ref 78.0–100.0)
Monocytes Absolute: 0.4 10*3/uL (ref 0.1–1.0)
Monocytes Relative: 6 % (ref 3–12)
NEUTROS PCT: 63 % (ref 43–77)
Neutro Abs: 4.1 10*3/uL (ref 1.7–7.7)
Platelets: 246 10*3/uL (ref 150–400)
RBC: 4.02 MIL/uL (ref 3.87–5.11)
RDW: 12.7 % (ref 11.5–15.5)
WBC: 6.4 10*3/uL (ref 4.0–10.5)

## 2013-12-04 LAB — PROTIME-INR
INR: 0.95 (ref 0.00–1.49)
PROTHROMBIN TIME: 12.5 s (ref 11.6–15.2)

## 2013-12-04 LAB — SURGICAL PCR SCREEN
MRSA, PCR: NEGATIVE
Staphylococcus aureus: NEGATIVE

## 2013-12-04 LAB — ABO/RH: ABO/RH(D): B POS

## 2013-12-04 LAB — APTT: aPTT: 29 seconds (ref 24–37)

## 2013-12-04 LAB — HCG, SERUM, QUALITATIVE: PREG SERUM: NEGATIVE

## 2013-12-04 NOTE — Pre-Procedure Instructions (Addendum)
RENELLE STEGENGA  12/04/2013   Your procedure is scheduled on:  12/11/13  Report to [facility] cone short stay admitting at 800 AM.  Call this number if you have problems the morning of surgery: [phone]   Remember:   Do not eat food or drink liquids after midnight.   Take these medicines the morning of surgery with A SIP OF WATER:inhaler,levothyroxine,singulair, omeprazole           Take all meds as ordered until day of surgery except as instructed below or per dr        Bridgette Habermann all herbel meds, nsaids (aleve,naproxen,advil,ibuprofen) 5 days prior to surgery including aspirin, vitamins, relafen   Do not wear jewelry, make-up or nail polish.  Do not wear lotions, powders, or perfumes. You may wear deodorant.  Do not shave 48 hours prior to surgery. Men may shave face and neck.  Do not bring valuables to the hospital.  [facility] is not responsible                  for any belongings or valuables.               Contacts, dentures or bridgework may not be worn into surgery.  Leave suitcase in the car. After surgery it may be brought to your room.  For patients admitted to the hospital, discharge time is determined by your                treatment team.               Patients discharged the day of surgery will not be allowed to drive  home.  Name and phone number of your driver:   Special Instructions:  Special Instructions:  - Preparing for Surgery  Before surgery, you can play an important role.  Because skin is not sterile, your skin needs to be as free of germs as possible.  You can reduce the number of germs on you skin by washing with CHG (chlorahexidine gluconate) soap before surgery.  CHG is an antiseptic cleaner which kills germs and bonds with the skin to continue killing germs even after washing.  Please DO NOT use if you have an allergy to CHG or antibacterial soaps.  If your skin becomes reddened/irritated stop using the CHG and inform your nurse when you arrive at  Short Stay.  Do not shave (including legs and underarms) for at least 48 hours prior to the first CHG shower.  You may shave your face.  Please follow these instructions carefully:   1.  Shower with CHG Soap the night before surgery and the morning of Surgery.  2.  If you choose to wash your hair, wash your hair first as usual with your normal shampoo.  3.  After you shampoo, rinse your hair and body thoroughly to remove the Shampoo.  4.  Use CHG as you would any other liquid soap.  You can apply chg directly  to the skin and wash gently with scrungie or a clean washcloth.  5.  Apply the CHG Soap to your body ONLY FROM THE NECK DOWN.  Do not use on open wounds or open sores.  Avoid contact with your eyes ears, mouth and genitals (private parts).  Wash genitals (private parts)       with your normal soap.  6.  Wash thoroughly, paying special attention to the area where your surgery will be performed.  7.  Thoroughly rinse your body  with warm water from the neck down.  8.  DO NOT shower/wash with your normal soap after using and rinsing off the CHG Soap.  9.  Pat yourself dry with a clean towel.            10.  Wear clean pajamas.            11.  Place clean sheets on your bed the night of your first shower and do not sleep with pets.  Day of Surgery  Do not apply any lotions/deodorants the morning of surgery.  Please wear clean clothes to the hospital/surgery center.   Please read over the following fact sheets that you were given: Pain Booklet, Coughing and Deep Breathing, Blood Transfusion Information, MRSA Information and Surgical Site Infection Prevention

## 2013-12-05 NOTE — Progress Notes (Addendum)
Anesthesia Chart Review:  Patient is a 28 year old female scheduled for left total hip revision on 12/11/13 by Dr. Mayer Camel.   History includes non-smoker, asthma, hypothyroidism, OSA without CPAP use, CP with gait abnormality, arthritis, PTSD, GERD, arthritis, left IHR, UHR, left THA '04.  I was not asked to evaluate patient at her PAT appointment.  She is currently undergoing a work-up for dysphagia with neurologist Dr. Leonie Man. She has an appointment scheduled to see him tomorrow afternoon.   EKG on 12/04/13 showed NSR.  CXR on 12/04/13 showed: No active cardiopulmonary disease.  MRI of the brain and c-spine on 10/14/13 showed: Normal MRI scan of the brain without contrast. Normal MRI scan of the cervical spine.  Nerve conduction studies of bilateral upper and lower extremities is unremarkable on 10/13/2013  Preoperative labs noted.  On 10/03/13, her AChR binding Ab was normal at < 0.03, but AChR blocking Ab was mildly elevated at 29% (normal 0-25%).  ANA was also positive.  I'll follow-up Dr. Clydene Fake note once available.  I sent him a staff message regarding plans for surgery in case he had additional recommendations.  [facility] Short Stay Center/Anesthesiology Phone [phone] 12/05/2013 2:24 PM  Addendum: 12/07/2013 6:00 PM Chart reviewed with anesthesiologist Dr. Linna Caprice earlier today.  Patient apparently was unable to see Dr. Leonie Man yesterday (she arrived too late).  Dr. Linna Caprice requested further input from Dr. Leonie Man regarding if he felt patient had myasthenia gravis.  I called and spoke with Dr. Clydene Fake nurse (RN) Butch Penny .  She had him review.  Butch Penny called me back and reported that Dr. 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  He felt it was okay for her to undergo anesthesia from his standpoint. She rescheduled to see Dr. Leonie Man on 03/21/14.

## 2013-12-06 ENCOUNTER — Ambulatory Visit: Payer: Medicaid Other | Admitting: Neurology

## 2013-12-06 ENCOUNTER — Encounter (INDEPENDENT_AMBULATORY_CARE_PROVIDER_SITE_OTHER): Payer: Self-pay

## 2013-12-10 DIAGNOSIS — T8484XA Pain due to internal orthopedic prosthetic devices, implants and grafts, initial encounter: Secondary | ICD-10-CM

## 2013-12-10 DIAGNOSIS — Z96649 Presence of unspecified artificial hip joint: Secondary | ICD-10-CM

## 2013-12-10 MED ORDER — CHLORHEXIDINE GLUCONATE 4 % EX LIQD
60.0000 mL | Freq: Once | CUTANEOUS | Status: DC
  Filled 2013-12-10: qty 60

## 2013-12-10 MED ORDER — CEFAZOLIN SODIUM-DEXTROSE 2-3 GM-% IV SOLR: 2.0000 g | INTRAVENOUS | Status: DC
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
## 2013-12-11 ENCOUNTER — Inpatient Hospital Stay (HOSPITAL_COMMUNITY): Payer: Medicaid Other

## 2013-12-11 ENCOUNTER — Telehealth: Payer: Self-pay | Admitting: Neurology

## 2013-12-11 ENCOUNTER — Encounter (HOSPITAL_COMMUNITY)
Admission: RE | Disposition: A | Discharge status: Skilled Nursing Facility | Payer: Self-pay | Source: Ambulatory Visit | Attending: Orthopedic Surgery

## 2013-12-11 ENCOUNTER — Encounter (HOSPITAL_COMMUNITY): Payer: Medicaid Other | Admitting: Vascular Surgery

## 2013-12-11 ENCOUNTER — Encounter (HOSPITAL_COMMUNITY): Payer: Self-pay | Admitting: *Deleted

## 2013-12-11 ENCOUNTER — Inpatient Hospital Stay (HOSPITAL_COMMUNITY): Payer: Medicaid Other | Admitting: Anesthesiology

## 2013-12-11 ENCOUNTER — Inpatient Hospital Stay (HOSPITAL_COMMUNITY)
Admission: RE | Admit: 2013-12-11 | Discharge: 2013-12-14 | DRG: 468 | Disposition: A | Discharge status: Skilled Nursing Facility | Payer: Medicaid Other | Source: Ambulatory Visit | Attending: Orthopedic Surgery | Admitting: Orthopedic Surgery

## 2013-12-11 DIAGNOSIS — R1310 Dysphagia, unspecified: Secondary | ICD-10-CM | POA: Diagnosis present

## 2013-12-11 DIAGNOSIS — M1610 Unilateral primary osteoarthritis, unspecified hip: Secondary | ICD-10-CM | POA: Diagnosis present

## 2013-12-11 DIAGNOSIS — K219 Gastro-esophageal reflux disease without esophagitis: Secondary | ICD-10-CM | POA: Diagnosis present

## 2013-12-11 DIAGNOSIS — G4730 Sleep apnea, unspecified: Secondary | ICD-10-CM | POA: Diagnosis present

## 2013-12-11 DIAGNOSIS — M25559 Pain in unspecified hip: Secondary | ICD-10-CM | POA: Diagnosis present

## 2013-12-11 DIAGNOSIS — M169 Osteoarthritis of hip, unspecified: Secondary | ICD-10-CM | POA: Diagnosis present

## 2013-12-11 DIAGNOSIS — J45909 Unspecified asthma, uncomplicated: Secondary | ICD-10-CM | POA: Diagnosis present

## 2013-12-11 DIAGNOSIS — Z96649 Presence of unspecified artificial hip joint: Secondary | ICD-10-CM

## 2013-12-11 DIAGNOSIS — Q6589 Other specified congenital deformities of hip: Secondary | ICD-10-CM

## 2013-12-11 DIAGNOSIS — T8484XA Pain due to internal orthopedic prosthetic devices, implants and grafts, initial encounter: Secondary | ICD-10-CM

## 2013-12-11 DIAGNOSIS — G809 Cerebral palsy, unspecified: Secondary | ICD-10-CM | POA: Diagnosis present

## 2013-12-11 DIAGNOSIS — F4310 Post-traumatic stress disorder, unspecified: Secondary | ICD-10-CM | POA: Diagnosis present

## 2013-12-11 DIAGNOSIS — Y831 Surgical operation with implant of artificial internal device as the cause of abnormal reaction of the patient, or of later complication, without mention of misadventure at the time of the procedure: Secondary | ICD-10-CM | POA: Diagnosis present

## 2013-12-11 DIAGNOSIS — R110 Nausea: Secondary | ICD-10-CM | POA: Diagnosis not present

## 2013-12-11 DIAGNOSIS — Z79899 Other long term (current) drug therapy: Secondary | ICD-10-CM

## 2013-12-11 DIAGNOSIS — E039 Hypothyroidism, unspecified: Secondary | ICD-10-CM | POA: Diagnosis present

## 2013-12-11 DIAGNOSIS — T8489XA Other specified complication of internal orthopedic prosthetic devices, implants and grafts, initial encounter: Principal | ICD-10-CM | POA: Diagnosis present

## 2013-12-11 DIAGNOSIS — Z7982 Long term (current) use of aspirin: Secondary | ICD-10-CM

## 2013-12-11 HISTORY — PX: REVISION TOTAL HIP ARTHROPLASTY: SHX766

## 2013-12-11 HISTORY — PX: TOTAL HIP REVISION: SHX763

## 2013-12-11 LAB — TYPE AND SCREEN
ABO/RH(D): B POS
Antibody Screen: NEGATIVE
Unit division: 0
Unit division: 0

## 2013-12-11 LAB — HEMOGLOBIN AND HEMATOCRIT, BLOOD
HCT: 33.5 % — ABNORMAL LOW (ref 36.0–46.0)
Hemoglobin: 10.9 g/dL — ABNORMAL LOW (ref 12.0–15.0)

## 2013-12-11 LAB — PREPARE RBC (CROSSMATCH)

## 2013-12-11 SURGERY — TOTAL HIP REVISION
Anesthesia: General | Site: Hip | Laterality: Left

## 2013-12-11 MED ORDER — HYDROMORPHONE HCL PF 1 MG/ML IJ SOLN
INTRAMUSCULAR | Status: AC
  Filled 2013-12-11: qty 1

## 2013-12-11 MED ORDER — TRANEXAMIC ACID 100 MG/ML IV SOLN
1000.0000 mg | INTRAVENOUS | Status: AC
  Administered 2013-12-11: 1000 mg via INTRAVENOUS
  Filled 2013-12-11: qty 10

## 2013-12-11 MED ORDER — METHOCARBAMOL 500 MG PO TABS
500.0000 mg | ORAL_TABLET | Freq: Four times a day (QID) | ORAL | Status: DC | PRN
  Administered 2013-12-12 – 2013-12-13 (×3): 500 mg via ORAL
  Filled 2013-12-11 (×4): qty 1

## 2013-12-11 MED ORDER — FENTANYL CITRATE 0.05 MG/ML IJ SOLN
INTRAMUSCULAR | Status: DC | PRN
Start: 2013-12-11 — End: 2013-12-11
  Administered 2013-12-11: 100 ug via INTRAVENOUS
  Administered 2013-12-11: 50 ug via INTRAVENOUS
  Administered 2013-12-11: 100 ug via INTRAVENOUS
  Administered 2013-12-11: 150 ug via INTRAVENOUS

## 2013-12-11 MED ORDER — ZOLPIDEM TARTRATE 5 MG PO TABS
5.0000 mg | ORAL_TABLET | Freq: Every evening | ORAL | Status: DC | PRN
  Administered 2013-12-14: 5 mg via ORAL
  Filled 2013-12-11: qty 1

## 2013-12-11 MED ORDER — METOCLOPRAMIDE HCL 5 MG/ML IJ SOLN
5.0000 mg | Freq: Three times a day (TID) | INTRAMUSCULAR | Status: DC | PRN
  Administered 2013-12-12: 10 mg via INTRAVENOUS
  Filled 2013-12-11: qty 2

## 2013-12-11 MED ORDER — NEOSTIGMINE METHYLSULFATE 10 MG/10ML IV SOLN
INTRAVENOUS | Status: DC | PRN
  Administered 2013-12-11: 3 mg via INTRAVENOUS

## 2013-12-11 MED ORDER — DIPHENHYDRAMINE HCL 12.5 MG/5ML PO ELIX: 12.5000 mg | ORAL_SOLUTION | ORAL | Status: DC | PRN

## 2013-12-11 MED ORDER — OXYCODONE HCL 5 MG PO TABS
5.0000 mg | ORAL_TABLET | ORAL | Status: DC | PRN
  Administered 2013-12-11 – 2013-12-14 (×11): 10 mg via ORAL
  Filled 2013-12-11 (×11): qty 2

## 2013-12-11 MED ORDER — METOCLOPRAMIDE HCL 5 MG PO TABS
5.0000 mg | ORAL_TABLET | Freq: Three times a day (TID) | ORAL | Status: DC | PRN
  Filled 2013-12-11: qty 2

## 2013-12-11 MED ORDER — BUPIVACAINE-EPINEPHRINE 0.5% -1:200000 IJ SOLN
INTRAMUSCULAR | Status: DC | PRN
  Administered 2013-12-11: 20 mL

## 2013-12-11 MED ORDER — DEXTROSE-NACL 5-0.45 % IV SOLN: INTRAVENOUS | Status: DC

## 2013-12-11 MED ORDER — BUPIVACAINE-EPINEPHRINE (PF) 0.5% -1:200000 IJ SOLN
INTRAMUSCULAR | Status: AC
  Filled 2013-12-11: qty 30

## 2013-12-11 MED ORDER — FENTANYL CITRATE 0.05 MG/ML IJ SOLN
INTRAMUSCULAR | Status: AC
  Filled 2013-12-11: qty 5

## 2013-12-11 MED ORDER — LORATADINE 10 MG PO TABS
10.0000 mg | ORAL_TABLET | Freq: Every day | ORAL | Status: DC
  Administered 2013-12-12 – 2013-12-14 (×3): 10 mg via ORAL
  Filled 2013-12-11 (×3): qty 1

## 2013-12-11 MED ORDER — MAGNESIUM CITRATE PO SOLN: 1.0000 | Freq: Once | ORAL | Status: AC | PRN

## 2013-12-11 MED ORDER — NEOSTIGMINE METHYLSULFATE 10 MG/10ML IV SOLN
INTRAVENOUS | Status: AC
  Filled 2013-12-11: qty 1

## 2013-12-11 MED ORDER — SENNOSIDES-DOCUSATE SODIUM 8.6-50 MG PO TABS: 1.0000 | ORAL_TABLET | Freq: Every evening | ORAL | Status: DC | PRN

## 2013-12-11 MED ORDER — MONTELUKAST SODIUM 10 MG PO TABS
10.0000 mg | ORAL_TABLET | Freq: Every day | ORAL | Status: DC
  Administered 2013-12-12 – 2013-12-14 (×3): 10 mg via ORAL
  Filled 2013-12-11 (×3): qty 1

## 2013-12-11 MED ORDER — LEVOTHYROXINE SODIUM 50 MCG PO TABS
50.0000 ug | ORAL_TABLET | Freq: Every day | ORAL | Status: DC
  Administered 2013-12-12 – 2013-12-14 (×3): 50 ug via ORAL
  Filled 2013-12-11 (×4): qty 1

## 2013-12-11 MED ORDER — LACTATED RINGERS IV SOLN
INTRAVENOUS | Status: DC
  Administered 2013-12-11 (×2): via INTRAVENOUS

## 2013-12-11 MED ORDER — PHENYLEPHRINE 40 MCG/ML (10ML) SYRINGE FOR IV PUSH (FOR BLOOD PRESSURE SUPPORT)
PREFILLED_SYRINGE | INTRAVENOUS | Status: AC
  Filled 2013-12-11: qty 10

## 2013-12-11 MED ORDER — ROCURONIUM BROMIDE 100 MG/10ML IV SOLN
INTRAVENOUS | Status: DC | PRN
  Administered 2013-12-11: 50 mg via INTRAVENOUS

## 2013-12-11 MED ORDER — PHENOL 1.4 % MT LIQD: 1.0000 | OROMUCOSAL | Status: DC | PRN

## 2013-12-11 MED ORDER — GLYCOPYRROLATE 0.2 MG/ML IJ SOLN
INTRAMUSCULAR | Status: DC | PRN
  Administered 2013-12-11: .4 mg via INTRAVENOUS

## 2013-12-11 MED ORDER — KCL IN DEXTROSE-NACL 20-5-0.45 MEQ/L-%-% IV SOLN
INTRAVENOUS | Status: DC
  Filled 2013-12-11 (×11): qty 1000

## 2013-12-11 MED ORDER — PANTOPRAZOLE SODIUM 40 MG PO TBEC
80.0000 mg | DELAYED_RELEASE_TABLET | Freq: Every day | ORAL | Status: DC
  Administered 2013-12-11 – 2013-12-14 (×3): 80 mg via ORAL
  Filled 2013-12-11 (×3): qty 2

## 2013-12-11 MED ORDER — PROPOFOL 10 MG/ML IV BOLUS
INTRAVENOUS | Status: AC
  Filled 2013-12-11: qty 20

## 2013-12-11 MED ORDER — MENTHOL 3 MG MT LOZG: 1.0000 | LOZENGE | OROMUCOSAL | Status: DC | PRN

## 2013-12-11 MED ORDER — LIDOCAINE HCL (CARDIAC) 20 MG/ML IV SOLN
INTRAVENOUS | Status: DC | PRN
  Administered 2013-12-11: 100 mg via INTRAVENOUS

## 2013-12-11 MED ORDER — ALBUMIN HUMAN 5 % IV SOLN
INTRAVENOUS | Status: DC | PRN
  Administered 2013-12-11: 13:00:00 via INTRAVENOUS

## 2013-12-11 MED ORDER — ACETAMINOPHEN 650 MG RE SUPP: 650.0000 mg | Freq: Four times a day (QID) | RECTAL | Status: DC | PRN

## 2013-12-11 MED ORDER — OXYCODONE HCL 5 MG/5ML PO SOLN: 5.0000 mg | Freq: Once | ORAL | Status: DC | PRN

## 2013-12-11 MED ORDER — MIDAZOLAM HCL 5 MG/5ML IJ SOLN
INTRAMUSCULAR | Status: DC | PRN
  Administered 2013-12-11: 2 mg via INTRAVENOUS

## 2013-12-11 MED ORDER — ACETAMINOPHEN 325 MG PO TABS: 650.0000 mg | ORAL_TABLET | Freq: Four times a day (QID) | ORAL | Status: DC | PRN

## 2013-12-11 MED ORDER — ASPIRIN EC 325 MG PO TBEC
325.0000 mg | DELAYED_RELEASE_TABLET | Freq: Every day | ORAL | Status: DC
  Administered 2013-12-12 – 2013-12-14 (×3): 325 mg via ORAL
  Filled 2013-12-11 (×4): qty 1

## 2013-12-11 MED ORDER — HYDROMORPHONE HCL PF 1 MG/ML IJ SOLN
1.0000 mg | INTRAMUSCULAR | Status: DC | PRN
  Administered 2013-12-12: 1 mg via INTRAVENOUS
  Filled 2013-12-11: qty 1

## 2013-12-11 MED ORDER — LIDOCAINE HCL (CARDIAC) 20 MG/ML IV SOLN
INTRAVENOUS | Status: AC
  Filled 2013-12-11: qty 5

## 2013-12-11 MED ORDER — SODIUM CHLORIDE 0.9 % IR SOLN
Status: DC | PRN
Start: 2013-12-11 — End: 2013-12-11
  Administered 2013-12-11: 1000 mL

## 2013-12-11 MED ORDER — ONDANSETRON HCL 4 MG/2ML IJ SOLN
INTRAMUSCULAR | Status: DC | PRN
  Administered 2013-12-11: 4 mg via INTRAVENOUS

## 2013-12-11 MED ORDER — PROPOFOL 10 MG/ML IV BOLUS
INTRAVENOUS | Status: DC | PRN
  Administered 2013-12-11 (×5): 20 mg via INTRAVENOUS
  Administered 2013-12-11: 100 mg via INTRAVENOUS

## 2013-12-11 MED ORDER — METHOCARBAMOL 1000 MG/10ML IJ SOLN
500.0000 mg | Freq: Four times a day (QID) | INTRAVENOUS | Status: DC | PRN
  Filled 2013-12-11: qty 5

## 2013-12-11 MED ORDER — VECURONIUM BROMIDE 10 MG IV SOLR
INTRAVENOUS | Status: DC | PRN
  Administered 2013-12-11: 3 mg via INTRAVENOUS

## 2013-12-11 MED ORDER — ONDANSETRON HCL 4 MG/2ML IJ SOLN: 4.0000 mg | Freq: Once | INTRAMUSCULAR | Status: DC | PRN

## 2013-12-11 MED ORDER — GLYCOPYRROLATE 0.2 MG/ML IJ SOLN
INTRAMUSCULAR | Status: AC
  Filled 2013-12-11: qty 2

## 2013-12-11 MED ORDER — OXYCODONE HCL 5 MG PO TABS: 5.0000 mg | ORAL_TABLET | Freq: Once | ORAL | Status: DC | PRN

## 2013-12-11 MED ORDER — DOCUSATE SODIUM 100 MG PO CAPS
100.0000 mg | ORAL_CAPSULE | Freq: Two times a day (BID) | ORAL | Status: DC
  Administered 2013-12-11 – 2013-12-14 (×6): 100 mg via ORAL
  Filled 2013-12-11 (×6): qty 1

## 2013-12-11 MED ORDER — ONDANSETRON HCL 4 MG/2ML IJ SOLN
4.0000 mg | Freq: Four times a day (QID) | INTRAMUSCULAR | Status: DC | PRN
  Administered 2013-12-12 (×2): 4 mg via INTRAVENOUS
  Filled 2013-12-11 (×2): qty 2

## 2013-12-11 MED ORDER — ONDANSETRON HCL 4 MG/2ML IJ SOLN
INTRAMUSCULAR | Status: AC
  Filled 2013-12-11: qty 2

## 2013-12-11 MED ORDER — ONDANSETRON HCL 4 MG PO TABS
4.0000 mg | ORAL_TABLET | Freq: Four times a day (QID) | ORAL | Status: DC | PRN
  Administered 2013-12-12 – 2013-12-13 (×4): 4 mg via ORAL
  Filled 2013-12-11 (×4): qty 1

## 2013-12-11 MED ORDER — HYDROMORPHONE HCL PF 1 MG/ML IJ SOLN
0.2500 mg | INTRAMUSCULAR | Status: DC | PRN
  Administered 2013-12-11 (×4): 0.5 mg via INTRAVENOUS

## 2013-12-11 MED ORDER — ALBUTEROL SULFATE (2.5 MG/3ML) 0.083% IN NEBU: 2.5000 mg | INHALATION_SOLUTION | Freq: Four times a day (QID) | RESPIRATORY_TRACT | Status: DC | PRN

## 2013-12-11 MED ORDER — ALBUTEROL SULFATE HFA 108 (90 BASE) MCG/ACT IN AERS: 2.0000 | INHALATION_SPRAY | Freq: Four times a day (QID) | RESPIRATORY_TRACT | Status: DC | PRN

## 2013-12-11 MED ORDER — MEPERIDINE HCL 25 MG/ML IJ SOLN: 6.2500 mg | INTRAMUSCULAR | Status: DC | PRN

## 2013-12-11 MED ORDER — MIDAZOLAM HCL 2 MG/2ML IJ SOLN
INTRAMUSCULAR | Status: AC
  Filled 2013-12-11: qty 2

## 2013-12-11 MED ORDER — CEFAZOLIN SODIUM-DEXTROSE 2-3 GM-% IV SOLR
INTRAVENOUS | Status: AC
  Administered 2013-12-11: 2 g via INTRAVENOUS
  Filled 2013-12-11: qty 50

## 2013-12-11 MED ORDER — FLUTICASONE PROPIONATE 50 MCG/ACT NA SUSP
1.0000 | Freq: Every day | NASAL | Status: DC
  Administered 2013-12-11 – 2013-12-13 (×3): 1 via NASAL
  Filled 2013-12-11: qty 16

## 2013-12-11 MED ORDER — BISACODYL 5 MG PO TBEC: 5.0000 mg | DELAYED_RELEASE_TABLET | Freq: Every day | ORAL | Status: DC | PRN

## 2013-12-11 SURGICAL SUPPLY — 75 items
BIT DRILL CANN LG 4.3MM (BIT) ×1 IMPLANT
BLADE 10 SAFETY STRL DISP (BLADE) ×3 IMPLANT
BLADE SAW SGTL 18.5X63.X.64 HD (BLADE) ×3 IMPLANT
BLADE SAW SGTL 81X20 HD (BLADE) ×6 IMPLANT
BLADE SAW SGTL NAR THIN XSHT (BLADE) ×3 IMPLANT
BOWL SMART MIX CTS (DISPOSABLE) IMPLANT
BRUSH FEMORAL CANAL (MISCELLANEOUS) IMPLANT
COVER SURGICAL LIGHT HANDLE (MISCELLANEOUS) ×3 IMPLANT
DRAPE C-ARM 42X72 X-RAY (DRAPES) IMPLANT
DRAPE ORTHO SPLIT 77X108 STRL (DRAPES) ×2
DRAPE PROXIMA HALF (DRAPES) ×3 IMPLANT
DRAPE SURG ORHT 6 SPLT 77X108 (DRAPES) ×1 IMPLANT
DRAPE U-SHAPE 47X51 STRL (DRAPES) ×3 IMPLANT
DRILL BIT 7/64X5 (BIT) ×3 IMPLANT
DRILL BIT CANN LG 4.3MM (BIT) ×3
DRSG AQUACEL AG ADV 3.5X10 (GAUZE/BANDAGES/DRESSINGS) ×3 IMPLANT
DRSG MEPILEX BORDER 4X12 (GAUZE/BANDAGES/DRESSINGS) ×3 IMPLANT
DRSG MEPILEX BORDER 4X8 (GAUZE/BANDAGES/DRESSINGS) ×3 IMPLANT
DURAPREP 26ML APPLICATOR (WOUND CARE) ×3 IMPLANT
ELECT BLADE 4.0 EZ CLEAN MEGAD (MISCELLANEOUS)
ELECT BLADE 6.5 EXT (BLADE) IMPLANT
ELECT REM PT RETURN 9FT ADLT (ELECTROSURGICAL) ×3
ELECTRODE BLDE 4.0 EZ CLN MEGD (MISCELLANEOUS) IMPLANT
ELECTRODE REM PT RTRN 9FT ADLT (ELECTROSURGICAL) ×1 IMPLANT
EVACUATOR 1/8 PVC DRAIN (DRAIN) IMPLANT
GAUZE XEROFORM 5X9 LF (GAUZE/BANDAGES/DRESSINGS) ×3 IMPLANT
GLOVE BIO SURGEON STRL SZ7.5 (GLOVE) ×3 IMPLANT
GLOVE BIO SURGEON STRL SZ8.5 (GLOVE) ×6 IMPLANT
GLOVE BIOGEL PI IND STRL 8 (GLOVE) ×2 IMPLANT
GLOVE BIOGEL PI IND STRL 9 (GLOVE) ×1 IMPLANT
GLOVE BIOGEL PI INDICATOR 8 (GLOVE) ×4
GLOVE BIOGEL PI INDICATOR 9 (GLOVE) ×2
GOWN STRL REUS W/ TWL LRG LVL3 (GOWN DISPOSABLE) ×2 IMPLANT
GOWN STRL REUS W/ TWL XL LVL3 (GOWN DISPOSABLE) ×5 IMPLANT
GOWN STRL REUS W/TWL LRG LVL3 (GOWN DISPOSABLE) ×4
GOWN STRL REUS W/TWL XL LVL3 (GOWN DISPOSABLE) ×10
HANDPIECE INTERPULSE COAX TIP (DISPOSABLE)
HEAD CER SROM 32MM PLUS (Hips) ×3 IMPLANT
HEEL PROTECTOR  874200 (MISCELLANEOUS)
HEEL PROTECTOR 874200 (MISCELLANEOUS) IMPLANT
HOOD PEEL AWAY FACE SHEILD DIS (HOOD) ×6 IMPLANT
KIT BASIN OR (CUSTOM PROCEDURE TRAY) ×3 IMPLANT
KIT ROOM TURNOVER OR (KITS) ×3 IMPLANT
LINER MAR 4 10 32X48 (Hips) ×2 IMPLANT
LINER MARATHON 4 10 32X48 (Hips) ×1 IMPLANT
MANIFOLD NEPTUNE II (INSTRUMENTS) ×3 IMPLANT
NEEDLE 22X1 1/2 (OR ONLY) (NEEDLE) ×3 IMPLANT
NOZZLE PRISM 8.5MM (MISCELLANEOUS) IMPLANT
NS IRRIG 1000ML POUR BTL (IV SOLUTION) ×6 IMPLANT
PACK TOTAL JOINT (CUSTOM PROCEDURE TRAY) ×3 IMPLANT
PAD ARMBOARD 7.5X6 YLW CONV (MISCELLANEOUS) ×6 IMPLANT
PASSER SUT SWANSON 36MM LOOP (INSTRUMENTS) IMPLANT
PRESSURIZER FEMORAL UNIV (MISCELLANEOUS) IMPLANT
SET HNDPC FAN SPRY TIP SCT (DISPOSABLE) IMPLANT
SLEEVE CABLE 2M VIT (Orthopedic Implant) ×9 IMPLANT
SLEEVE SURGEON STRL (DRAPES) IMPLANT
SPONGE GAUZE 4X4 12PLY (GAUZE/BANDAGES/DRESSINGS) ×3 IMPLANT
SPONGE LAP 18X18 X RAY DECT (DISPOSABLE) IMPLANT
STAPLER VISISTAT 35W (STAPLE) ×6 IMPLANT
STEM HIP16X11X36  LNG STD S-RO (Stem) ×2 IMPLANT
STEM HIP16X11X36 LNG STD S-RO (Stem) ×1 IMPLANT
SUT ETHIBOND 2 V 37 (SUTURE) ×3 IMPLANT
SUT ETHILON 3 0 FSL (SUTURE) ×3 IMPLANT
SUT VIC AB 0 CTB1 27 (SUTURE) ×3 IMPLANT
SUT VIC AB 1 CTX 36 (SUTURE) ×2
SUT VIC AB 1 CTX36XBRD ANBCTR (SUTURE) ×1 IMPLANT
SUT VIC AB 2-0 CTB1 (SUTURE) ×3 IMPLANT
SYR 20ML ECCENTRIC (SYRINGE) ×3 IMPLANT
SYR CONTROL 10ML LL (SYRINGE) ×3 IMPLANT
TOWEL OR 17X24 6PK STRL BLUE (TOWEL DISPOSABLE) ×3 IMPLANT
TOWEL OR 17X26 10 PK STRL BLUE (TOWEL DISPOSABLE) ×3 IMPLANT
TOWER CARTRIDGE SMART MIX (DISPOSABLE) IMPLANT
TRAY FOLEY CATH 14FR (SET/KITS/TRAYS/PACK) IMPLANT
TUBE ANAEROBIC SPECIMEN COL (MISCELLANEOUS) ×3 IMPLANT
WATER STERILE IRR 1000ML POUR (IV SOLUTION) ×9 IMPLANT

## 2013-12-11 NOTE — Op Note (Signed)
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  Postoperative diagnosis: Same, stem anteversion was 70, cup anteversion 30  Procedure: Revision left total hip arthroplasty with removal of 40 mm DePuy polyethylene liner and 20 mm femoral head femoral head and 11 x 16 x 1 50 x 36 S-ROM stem that was anteverted approximately 70 revision to a 48 mm polyethylene liner index superior anterior, a +0 36 mm ceramic head, 20 5 x 16 x 11 x 36 first revision S-ROM stem, we also made a window in the femur it was put back on with 3 Dall-Miles cables appear  Surgeon: Kathalene Frames. Mayer Camel M.D.  Assistant: Kerry Hough. Barton Dubois  (present throughout entire procedure and necessary for timely completion of the procedure)  Estimated blood loss: 800 cc  Fluid replacement: 1800 cc of crystalloid  Complications: None  Indications: Patient with left total up arthroplasty that was done with a Pinnacle cup and S-ROM stem 12 years ago for developmental hip dysplasia. She has a greater than five-year history of severe pain in the femur lateral aspect of the hip and anterior aspect of the hip. Plain x-rays and CT scan have not shown any evidence of loosening although she has a little but of extra anteversion on acetabular component appear The pain wakes her up at night and recently got to the point where she could no longer go walking on a regular basis.  The only over the Emi Belfast is been a diagnostic injection that provided a few days worth of pain relief appear Risks and benefits of revision surgery have been discussed and questions answered.  Procedure: Patient was identified by arm band receive preoperative IV antibiotics in the holding area at, and hospital. She was then taken to the operating room where the appropriate anesthetic monitors were attached and general endotracheal anesthesia induced with the patient in the supine position. He was then rolled into  the R lateral decubitus position and fixed there with a mark 2 pelvic clamp. A Foley catheter was inserted and the limb prepped and draped in usual sterile fashion from the ankle to the hemipelvis. Time out procedure performed. Skin along the lateral hip and thigh infiltrated with 10 cc of 1/2% Marcaine and epinephrine solution. We began the operation by recreating the old posterior lateral incision 15 cm in line through the skin and subcutaneous tissue down to the level of the IT band which was cut in line with the skin incision. We then peeled the pseudocapsule off the intertrochanteric crest and tagged at 2 #2 Ethibond suture, exposing the ceramic head and polyethylene liner which were relatively good condition although somewhat excessively anteverted. The hip was then dislocated the ceramic head was removed with a mallet and cylinder and then we attempted to remove the S-ROM stem using the flat screwdriver wedge. We're able to move the stem approximately 1/2 cm before became stuck. C-arm images were taken confirming no fracture. We then extended the skin incision for another 10 cm, elevated the vastus lateralis and made a window from a below the lesser trochanter over a distance of 10 cm and the lateral cortex of the anterior cruciate ligament saw. This revealed bone grown into the coronal slot of the S-ROM stem was removed and once this was accomplished the stem was then extracted with a slap hammer. The wound was kept at the back table for later insertion. Direct her attention back to the acetabulum the polyethylene  liner was removed with the Depew liner removal tool and a new 10 polyethylene liner 48 mm except a 36 mm head was hammered into place with the index superior anterior removing about 7 of anteversion, down to about 25 the we then sequentially reamed the femur up to 11.5 mm full depth for a 205 mm stem and 12 mm proximally. Trials were then performed, the original stem was anteverted 70 we backed  off to 30 and the hip had excellent stability. At this point we selected a 205 mm x 16 mm x 11 mm revision stem and hammered it into place and 25 of anteversion and placed a new + 0 36 mm ceramic head was hammered into place. Particular bone graft was then placed in the window and the window itself was then wired back into place using 32 mm dome mild cables. The wound was then thoroughly irrigated with normal saline solution. The capsular flap was repaired back to the intertrochanteric crest through drill holes with #2 Ethibond suture. The vastus lateralis was closed with running 0 suture. We then closed the IT band with running #1 Vicryl suture, the subcutaneous tissue with 0 and 2-0 undyed Vicryl suture, the skin was closed with running interlocking 3-0 nylon suture. A dressing of Mepilex was then applied, the patient was unclamped a rolled supine awakened extubated and taken to the recovery without difficulty.

## 2013-12-11 NOTE — Transfer of Care (Signed)
Immediate Anesthesia Transfer of Care Note  Patient: Pamela Palmer  Procedure(s) Performed: Procedure(s): TOTAL HIP REVISION (Left)  Patient Location: PACU  Anesthesia Type:General  Level of Consciousness: awake, alert  and oriented  Airway & Oxygen Therapy: Patient Spontanous Breathing and Patient connected to nasal cannula oxygen  Post-op Assessment: Report given to PACU RN and Post -op Vital signs reviewed and stable  Post vital signs: Reviewed and stable  Complications: No apparent anesthesia complications

## 2013-12-11 NOTE — Plan of Care (Signed)
Problem: Consults Goal: Diagnosis- Total Joint Replacement Revision Total Hip Left        

## 2013-12-11 NOTE — Telephone Encounter (Signed)
Spoke with Ebony Hail, [place] from Anesthesia dept at [facility] for clarification on patient's diagnosis.  Patient scheduled for surgery on 12-11-13 and doctors wanted verification if the patient had Myasthenia Gravis.   I spoke with Dr. Leonie Man, he stated tests results showed she did not have MG.   Patient was not seen on 12-06-13, and never had her speech therapy evaluation.  I returned call to [facility] and relayed information about patient.

## 2013-12-11 NOTE — Anesthesia Preprocedure Evaluation (Addendum)
Anesthesia Evaluation  Patient identified by MRN, date of birth, ID band Patient awake    Reviewed: Allergy & Precautions, H&P , NPO status , Patient's Chart, lab work & pertinent test results  Airway Mallampati: II TM Distance: >3 FB Neck ROM: Full    Dental  (+) Teeth Intact   Pulmonary asthma , sleep apnea ,          Cardiovascular     Neuro/Psych  Headaches, Anxiety Cerebral palsy     GI/Hepatic GERD-  Controlled,  Endo/Other  Hypothyroidism   Renal/GU      Musculoskeletal   Abdominal   Peds  Hematology   Anesthesia Other Findings   Reproductive/Obstetrics                         Anesthesia Physical Anesthesia Plan  ASA: III  Anesthesia Plan: General   Post-op Pain Management:    Induction: Intravenous  Airway Management Planned: Oral ETT  Additional Equipment:   Intra-op Plan:   Post-operative Plan: Extubation in OR  Informed Consent: I have reviewed the patients History and Physical, chart, labs and discussed the procedure including the risks, benefits and alternatives for the proposed anesthesia with the patient or authorized representative who has indicated his/her understanding and acceptance.   Dental advisory given  Plan Discussed with: CRNA and Surgeon  Anesthesia Plan Comments:        Anesthesia Quick Evaluation

## 2013-12-11 NOTE — Interval H&P Note (Signed)
History and Physical Interval Note:  12/11/2013 9:04 AM  Pamela Palmer  has presented today for surgery, with the diagnosis of PAINFUL LEFT TOTAL HIP ARTHROPLASTY  The various methods of treatment have been discussed with the patient and family. After consideration of risks, benefits and other options for treatment, the patient has consented to  Procedure(s): TOTAL HIP REVISION (Left) as a surgical intervention .  The patient's history has been reviewed, patient examined, no change in status, stable for surgery.  I have reviewed the patient's chart and labs.  Questions were answered to the patient's satisfaction.     Kerin Salen

## 2013-12-12 ENCOUNTER — Encounter (HOSPITAL_COMMUNITY): Payer: Self-pay | Admitting: General Practice

## 2013-12-12 LAB — CBC
HEMATOCRIT: 30.7 % — AB (ref 36.0–46.0)
Hemoglobin: 10 g/dL — ABNORMAL LOW (ref 12.0–15.0)
MCH: 27.5 pg (ref 26.0–34.0)
MCHC: 32.6 g/dL (ref 30.0–36.0)
MCV: 84.6 fL (ref 78.0–100.0)
Platelets: 227 10*3/uL (ref 150–400)
RBC: 3.63 MIL/uL — ABNORMAL LOW (ref 3.87–5.11)
RDW: 12.6 % (ref 11.5–15.5)
WBC: 13.7 10*3/uL — ABNORMAL HIGH (ref 4.0–10.5)

## 2013-12-12 LAB — BASIC METABOLIC PANEL
BUN: 5 mg/dL — ABNORMAL LOW (ref 6–23)
CO2: 20 mEq/L (ref 19–32)
CREATININE: 0.5 mg/dL (ref 0.50–1.10)
Calcium: 8.4 mg/dL (ref 8.4–10.5)
Chloride: 98 mEq/L (ref 96–112)
GFR calc Af Amer: >90 mL/min (ref >90)
Glucose, Bld: 173 mg/dL — ABNORMAL HIGH (ref 70–99)
Potassium: 3.8 mEq/L (ref 3.7–5.3)
Sodium: 132 mEq/L — ABNORMAL LOW (ref 137–147)

## 2013-12-12 MED ORDER — PNEUMOCOCCAL VAC POLYVALENT 25 MCG/0.5ML IJ INJ
0.5000 mL | INJECTION | INTRAMUSCULAR | Status: AC
  Administered 2013-12-14: 0.5 mL via INTRAMUSCULAR
  Filled 2013-12-12: qty 0.5

## 2013-12-12 NOTE — Progress Notes (Signed)
Patient ID: Pamela Palmer, female   DOB: 1986-04-20, 28 y.o.   MRN: [id number] PATIENT ID: Pamela Palmer  MRN: [id number]  DOB/AGE:  [birth year] / 28 y.o.  1 Day Post-Op Procedure(s) (LRB): TOTAL HIP REVISION (Left)    PROGRESS NOTE Subjective: Patient is alert, oriented, had nausea yesterday, no Vomiting, yes passing gas, no Bowel Movement. Taking PO sips. Denies SOB, Chest or Calf Pain. Using Incentive Spirometer, PAS in place. Ambulate WBAT today Patient reports pain as 3 on 0-10 scale  .    Objective: Vital signs in last 24 hours: Filed Vitals:   12/11/13 1750 12/11/13 2009 12/12/13 0001 12/12/13 0501  BP: 105/64 131/82 114/63 132/77  Pulse: 78 88 103 112  Temp: 98.1 F (36.7 C) 98.1 F (36.7 C) 98.1 F (36.7 C) 98.6 F (37 C)  TempSrc:  Oral Oral Oral  Resp: 14 16 16 16   Weight:      SpO2: 100% 100% 98% 100%      Intake/Output from previous day: I/O last 3 completed shifts: In: 7628 [P.O.:100; I.V.:3700; IV Piggyback:250] Out: 3050 [Urine:2250; Blood:800]   Intake/Output this shift:     LABORATORY DATA:  Recent Labs  12/11/13 2115 12/12/13 0517  WBC  --  13.7*  HGB 10.9* 10.0*  HCT 33.5* 30.7*  PLT  --  227  NA  --  132*  K  --  3.8  CL  --  98  CO2  --  20  BUN  --  5*  CREATININE  --  0.50  GLUCOSE  --  173*  CALCIUM  --  8.4    Examination: Neurologically intact ABD soft Neurovascular intact Sensation intact distally Intact pulses distally Dorsiflexion/Plantar flexion intact Incision: no drainage No cellulitis present Compartment soft} XR AP&Lat of hip shows well placed\fixed THA  Assessment:   1 Day Post-Op Procedure(s) (LRB): TOTAL HIP REVISION (Left) ADDITIONAL DIAGNOSIS:  History of cerebral palsy  Plan: PT/OT WBAT, THA  posterior precautions  DVT Prophylaxis: SCDx72 hrs, ASA 325 mg BID x 2 weeks  DISCHARGE PLAN: Home  DISCHARGE NEEDS: HHPT, HHRN, CPM, Walker and 3-in-1 comode seat

## 2013-12-12 NOTE — Anesthesia Postprocedure Evaluation (Signed)
Anesthesia Post Note  Patient: Pamela Palmer  Procedure(s) Performed: Procedure(s) (LRB): TOTAL HIP REVISION (Left)  Anesthesia type: general  Patient location: PACU  Post pain: Pain level controlled  Post assessment: Patient's Cardiovascular Status Stable  Last Vitals:  Filed Vitals:   12/12/13 1600  BP:   Pulse:   Temp:   Resp: 16    Post vital signs: Reviewed and stable  Level of consciousness: sedated  Complications: No apparent anesthesia complications

## 2013-12-12 NOTE — Clinical Social Work Psychosocial (Signed)
Clinical Social Work Department  BRIEF PSYCHOSOCIAL ASSESSMENT  Patient: Pamela Palmer  Account Number: [id number]   Admit date: 12/11/13 Clinical Social Worker Rhea Pink, MSW Date/Time: 12/12/2013 2:00 PM Referred by: Physician Date Referred:  Referred for   SNF Placement   Other Referral:  Interview type: Patient and patient's fiance at bedside  Other interview type: PSYCHOSOCIAL DATA  Living Status: patient lives with her fiance Admitted from facility:  Level of care:  Primary support name: Loralee Pacas Primary support relationship to patient: Fiance Degree of support available:  Strong and vested  CURRENT CONCERNS  Current Concerns   Post-Acute Placement   Other Concerns:  SOCIAL WORK ASSESSMENT / PLAN  CSW met with pt and patient's fiance at bedside to offer support and discuss SNF placement. Patient can go to [facility] and receive PT/OT with her medicaid. CSW stressed that patient would have to stay at least a full 30 days at the facility. Patient was agreeable to this. Patient and patient's fiance thanked CSW for support and information.  re: PT recommendation for SNF.   Pt lives with fiance  CSW explained placement process and answered questions.   [facility]    CSW completed FL2 and initiated SNF search.     Assessment/plan status: Information/Referral to [employer]  Other assessment/ plan:  Information/referral to community resources:  SNF   PTAR  PATIENT'S/FAMILY'S RESPONSE TO PLAN OF CARE:  Pt  reports she is agreeable to ST SNF(30 days) in order to increase strength and independence with mobility prior to returning home  Pt verbalized understanding of placement process and appreciation for CSW assist.   Rhea Pink, MSW, [place]

## 2013-12-12 NOTE — Care Management Note (Signed)
CARE MANAGEMENT NOTE 12/12/2013  Patient:  Pamela Palmer, Pamela Palmer   Account Number:  [id number]  Date Initiated:  12/12/2013  Documentation initiated by:  Ricki Miller  Subjective/Objective Assessment:   28 yr old female s/p left hip revision. Patient has hx. of cerebral palsy.     Action/Plan:   Patient is not able to care for self at home, fiance works and unable to be available. Social worker is aware of need [place] SNF placement.   Anticipated DC Date:  12/13/2013   Anticipated DC Plan:  SKILLED NURSING FACILITY  In-house referral  Clinical Social Worker      DC Planning Services  CM consult      Choice offered to / List presented to:             Status of service:  In process, will continue to follow Medicare Important Message given?   (If response is "NO", the following Medicare IM given date fields will be blank) Date Medicare IM given:   Date Additional Medicare IM given:    Discharge Disposition:    Per UR Regulation:    If discussed at Long Length of Stay Meetings, dates discussed:    Comments:  12/12/13 Magdalena, RN BSN Case Manager CM received a call from Lianne Cure, lady that assists patient with appointments etc., concerned that should patient  discharge to home she would be unattended. CM informed her that Social work is working on getting patient a SNF bed.

## 2013-12-12 NOTE — Progress Notes (Signed)
Utilization review completed.  

## 2013-12-12 NOTE — Evaluation (Signed)
Physical Therapy Evaluation Patient Details Name: Pamela Palmer MRN: [id number] DOB: 07/12/1986 Today's Date: 12/12/2013   History of Present Illness  pt presents with L THRevision with original THA ~38yrs ago per pt.    Clinical Impression  Pt nauseated and diaphoretic during mobility.  Pt was able to transfer to 3-in-1 and to chair with MinA.  RN present and gave meds for nausea.  Pt lives alone and won't have adequate support at D/C.  Will need SNF level of care for further rehab.  Will continue to follow.      Follow Up Recommendations SNF    Equipment Recommendations  None recommended by PT    Recommendations for Other Services       Precautions / Restrictions Precautions Precautions: Posterior Hip;Fall Precaution Booklet Issued: Yes (comment) Restrictions Weight Bearing Restrictions: Yes LLE Weight Bearing: Weight bearing as tolerated      Mobility  Bed Mobility Overal bed mobility: Needs Assistance Bed Mobility: Supine to Sit     Supine to sit: Mod assist;HOB elevated     General bed mobility comments: A with L LE and bringing hips around to EOB with use of pad.   Transfers Overall transfer level: Needs assistance Equipment used: Rolling walker (2 wheeled) Transfers: Sit to/from [facility] Sit to Stand: Min assist Stand pivot transfers: Min assist       General transfer comment: cues for UE use and positioning of LEs.  pt moves slowly with pivot, but demos good use of RW.    Ambulation/Gait                Stairs            Wheelchair Mobility    Modified Rankin (Stroke Patients Only)       Balance Overall balance assessment: Needs assistance Sitting-balance support: Single extremity supported;Feet supported Sitting balance-Leahy Scale: Poor Sitting balance - Comments: pt only able to maintain sitting balance for brief periods without UE support.   Postural control: Right lateral lean Standing balance support:  Single extremity supported;During functional activity Standing balance-Leahy Scale: Poor Standing balance comment: pt needs single UE support to maintain balance.                               Pertinent Vitals/Pain L hip during mobility 5/10.  Premedicated.      Home Living Family/patient expects to be discharged to:: Skilled nursing facility Living Arrangements: Alone                    Prior Function Level of Independence: Independent               Hand Dominance        Extremity/Trunk Assessment   Upper Extremity Assessment: Defer to OT evaluation           Lower Extremity Assessment: RLE deficits/detail;LLE deficits/detail;Generalized weakness RLE Deficits / Details: Per pt R hip pain and MD anticipates THA in near future.   LLE Deficits / Details: Post-op pain and weakness.       Communication   Communication: No difficulties  Cognition Arousal/Alertness: Awake/alert Behavior During Therapy: WFL for tasks assessed/performed Overall Cognitive Status: Within Functional Limits for tasks assessed                      General Comments      Exercises Total Joint Exercises Ankle Circles/Pumps: AROM;Both;10 reps  Assessment/Plan    PT Assessment Patient needs continued PT services  PT Diagnosis Abnormality of gait;Acute pain   PT Problem List Decreased strength;Decreased activity tolerance;Decreased balance;Decreased mobility;Decreased coordination;Decreased knowledge of use of DME;Decreased knowledge of precautions;Pain  PT Treatment Interventions DME instruction;Gait training;Functional mobility training;Therapeutic activities;Therapeutic exercise;Balance training;Patient/family education   PT Goals (Current goals can be found in the Care Plan section) Acute Rehab PT Goals Patient Stated Goal: Get better PT Goal Formulation: With patient Time For Goal Achievement: 12/26/13 Potential to Achieve Goals: Good    Frequency  7X/week   Barriers to discharge        Co-evaluation               End of Session Equipment Utilized During Treatment: Gait belt Activity Tolerance: Patient limited by fatigue (Nausea) Patient left: in chair;with call bell/phone within reach Nurse Communication: Mobility status         Time: 5170-0174 PT Time Calculation (min): 38 min   Charges:   PT Evaluation $Initial PT Evaluation Tier I: 1 Procedure PT Treatments $Therapeutic Activity: 23-37 mins   PT G CodesCatarina Hartshorn, PT [phone] 12/12/2013, 9:51 AM

## 2013-12-12 NOTE — Clinical Social Work Placement (Signed)
Clinical Social Work Department  CLINICAL SOCIAL WORK PLACEMENT NOTE   Patient: Naida Sleight  Account Number: [id number]  Admit date: 12/11/13 Clinical Social Worker: Rhea Pink LCSWA Date/time: 12/12/2013 1:30 PM  Clinical Social Work is seeking post-discharge placement for this patient at the following level of care: SKILLED NURSING (*CSW will update this form in Epic as items are completed)  5/19/2015Patient/family provided with [place] Department of Clinical Social Work's list of facilities offering this level of care within the geographic area requested by the patient (or if unable, by the patient's family).  12/12/2013 Patient/family informed of their freedom to choose among providers that offer the needed level of care, that participate in Medicare, Medicaid or managed care program needed by the patient, have an available bed and are willing to accept the patient.  5/19/2015Patient/family informed of [facility]' ownership interest in [facility], as well as of the fact that they are under no obligation to receive care at this facility.  PASARR submitted to EDS on   PASARR number received from [place] on  FL2 transmitted to all facilities in geographic area requested by pt/family on 12/12/2013 FL2 transmitted to all facilities within larger geographic area on  Patient informed that his/her managed care company has contracts with or will negotiate with certain facilities, including the following:  Patient/family informed of bed offers received: 12/12/2013  Patient chooses bed at [facility] [place] Physician recommends and patient chooses bed at  Patient to be transferred to on  Patient to be transferred to facility by  The following physician request were entered in Epic:  Additional Comments:

## 2013-12-13 LAB — CBC
HCT: 30.1 % — ABNORMAL LOW (ref 36.0–46.0)
Hemoglobin: 9.6 g/dL — ABNORMAL LOW (ref 12.0–15.0)
MCH: 27.2 pg (ref 26.0–34.0)
MCHC: 31.9 g/dL (ref 30.0–36.0)
MCV: 85.3 fL (ref 78.0–100.0)
PLATELETS: 225 10*3/uL (ref 150–400)
RBC: 3.53 MIL/uL — ABNORMAL LOW (ref 3.87–5.11)
RDW: 13 % (ref 11.5–15.5)
WBC: 15.1 10*3/uL — ABNORMAL HIGH (ref 4.0–10.5)

## 2013-12-13 MED ORDER — METHOCARBAMOL 500 MG PO TABS: 500.0000 mg | ORAL_TABLET | Freq: Two times a day (BID) | ORAL | Status: DC

## 2013-12-13 MED ORDER — ASPIRIN EC 325 MG PO TBEC: 325.0000 mg | DELAYED_RELEASE_TABLET | Freq: Two times a day (BID) | ORAL | Status: DC

## 2013-12-13 MED ORDER — OXYCODONE-ACETAMINOPHEN 5-325 MG PO TABS: 1.0000 | ORAL_TABLET | ORAL | Status: DC | PRN

## 2013-12-13 NOTE — Progress Notes (Signed)
PATIENT ID: Pamela Palmer  MRN: [id number]  DOB/AGE:  [birth year] / 28 y.o.  2 Days Post-Op Procedure(s) (LRB): TOTAL HIP REVISION (Left)    PROGRESS NOTE Subjective: Patient is alert, oriented,no Nausea, no Vomiting, yes passing gas, no Bowel Movement. Taking PO sips. Denies SOB, Chest or Calf Pain. Using Incentive Spirometer, PAS in place. Ambulate WBAT Patient reports pain as 8 on 0-10 scale  .    Objective: Vital signs in last 24 hours: Filed Vitals:   12/12/13 1522 12/12/13 1600 12/12/13 1940 12/13/13 0502  BP: 138/83  109/56 110/60  Pulse: 113  74 100  Temp: 98.4 F (36.9 C)  98.4 F (36.9 C) 98.5 F (36.9 C)  TempSrc: Oral  Oral Oral  Resp: 18 16 18 16   Weight:      SpO2: 100%  98% 99%      Intake/Output from previous day: I/O last 3 completed shifts: In: 2480 [P.O.:820; I.V.:1660] Out: 2251 [Urine:2250; Emesis/NG output:1]   Intake/Output this shift: Total I/O In: -  Out: 120 [Urine:120]   LABORATORY DATA:  Recent Labs  12/12/13 0517 12/13/13 0610  WBC 13.7* PENDING  HGB 10.0* 9.6*  HCT 30.7* 30.1*  PLT 227 225  NA 132*  --   K 3.8  --   CL 98  --   CO2 20  --   BUN 5*  --   CREATININE 0.50  --   GLUCOSE 173*  --   CALCIUM 8.4  --     Examination: Neurologically intact Neurovascular intact Sensation intact distally Intact pulses distally Dorsiflexion/Plantar flexion intact Incision: dressing C/D/I No cellulitis present Compartment soft} XR AP&Lat of hip shows well placed\fixed THA  Assessment:   2 Days Post-Op Procedure(s) (LRB): TOTAL HIP REVISION (Left) ADDITIONAL DIAGNOSIS:  History of cerebral palsy  Plan: PT/OT WBAT, THA  posterior precautions  DVT Prophylaxis: SCDx72 hrs, ASA 325 mg BID x 2 weeks  DISCHARGE PLAN: Skilled Nursing Facility[facility]Rehab, [facility]   DISCHARGE NEEDS: HHPT, HHRN, Walker and 3-in-1 comode seat

## 2013-12-13 NOTE — Progress Notes (Signed)
Physical Therapy Treatment Patient Details Name: Pamela Palmer MRN: [id number] DOB: [birth year] Today's Date: 12/13/2013    History of Present Illness pt presents with L THRevision with original THA ~55yrs ago per pt.  Hx of CP.     PT Comments    Pt ambulated 30 ft today using RW and min-A for safety, with seated rest after initial 20 ft.  Pt performed sit to/from stand transfers with min guard for safety due to decreased control during descent onto seat.  Pt had difficulty initiating quad sets in seated with bil. LE extended, required multiple teaching strategies to achieve quad sets with minimal activation of quads.  Will continue to follow.   Follow Up Recommendations  SNF     Equipment Recommendations  None recommended by PT    Recommendations for Other Services       Precautions / Restrictions Precautions Precautions: Posterior Hip;Fall Precaution Comments: Discussed hip precautions with pt teach-back Restrictions Weight Bearing Restrictions: Yes LLE Weight Bearing: Weight bearing as tolerated    Mobility  Bed Mobility               General bed mobility comments: Not assessed- Pt in chair upon PT arrival  Transfers Overall transfer level: Needs assistance Equipment used: Rolling walker (2 wheeled) Transfers: Sit to/from Stand Sit to Stand: Min guard         General transfer comment: Pt transfers sit to/from stand slowly with decreased control during descent.  Min guard for safety.  VC provided for hand placement but pt was able to initiate without VC on repeated sit to/from stand transfers.  Ambulation/Gait Ambulation/Gait assistance: Min assist Ambulation Distance (Feet): 30 Feet Assistive device: Rolling walker (2 wheeled) Gait Pattern/deviations: Step-through pattern;Decreased stance time - left;Decreased step length - right;Decreased weight shift to left   Gait velocity interpretation: Below normal speed for age/gender General Gait Details: Pt  ambulated with no need of VC for use of RW.  Min-A provided for safety with balance and careful monitoring of pt need to sit and rest due to UE fatigue from RW use.  Pt required seated rest after first 20 feet of ambulation.   Stairs            Wheelchair Mobility    Modified Rankin (Stroke Patients Only)       Balance Overall balance assessment: Needs assistance         Standing balance support: Bilateral upper extremity supported Standing balance-Leahy Scale: Poor Standing balance comment: Pt used bil. UE to assist with standing balance throughout session.                    Cognition Arousal/Alertness: Awake/alert Behavior During Therapy: WFL for tasks assessed/performed Overall Cognitive Status: Within Functional Limits for tasks assessed                      Exercises Total Joint Exercises Ankle Circles/Pumps: AROM;5 reps;Both;Seated Quad Sets: AROM;Left;10 reps;Seated    General Comments General comments (skin integrity, edema, etc.): Pt requesting information about CP.      Pertinent Vitals/Pain Pt reports 7-8/10 pain.  Pt took meds as PT arrived for therapy.    Home Living                      Prior Function            PT Goals (current goals can now be found in the care plan section) Acute Rehab  PT Goals Patient Stated Goal: Get better PT Goal Formulation: With patient Time For Goal Achievement: 12/26/13 Potential to Achieve Goals: Good    Frequency  7X/week    PT Plan Current plan remains appropriate    Co-evaluation             End of Session Equipment Utilized During Treatment: Gait belt Activity Tolerance: Patient limited by fatigue Patient left: in chair;with call bell/phone within reach     Time: 1100-1139 PT Time Calculation (min): 39 min  Charges:                       G Codes:      Shaquia Berkley, SPT 12/13/2013, 12:02 PM

## 2013-12-13 NOTE — Evaluation (Signed)
Occupational Therapy Evaluation Patient Details Name: Pamela Palmer MRN: [id number] DOB: 28-Aug-1985 Today's Date: 12/13/2013    History of Present Illness pt presents with L THRevision with original THA ~34yr ago per pt.  Hx of CP.    Clinical Impression   Pt seen for acute OT bedside eval. Assessed AE needs and supplied hip kit. Educated on use of AE and reviewed precautions with pt. Pt has WFL BUE AROM; generalized weakness in BUE: LUE grossly 3+/5, RUE grossly 3/5.     Follow Up Recommendations  SNF    Equipment Recommendations  Other (comment) (pt supplied with hip kit)    Recommendations for Other Services       Precautions / Restrictions Precautions Precautions: Posterior Hip;Fall Precaution Comments: able to state 1/3 precautions; education given on precautions Restrictions Weight Bearing Restrictions: Yes LLE Weight Bearing: Weight bearing as tolerated      Mobility Bed Mobility                  Transfers                      Balance                                            ADL                                        General ADL Comments: Bedside eval to assess AE needs and BUE AROM and strength.      Vision                     Perception     Praxis      Pertinent Vitals/Pain 8/10, nursing notified of pt request for pain med     Hand Dominance Left   Extremity/Trunk Assessment Upper Extremity Assessment Upper Extremity Assessment: Generalized weakness (L hand stronger than R; WFL BUE AROM; )   Lower Extremity Assessment Lower Extremity Assessment: Defer to PT evaluation       Communication Communication Communication: No difficulties   Cognition Arousal/Alertness: Awake/alert Behavior During Therapy: Flat affect Overall Cognitive Status: Within Functional Limits for tasks assessed       Memory: Decreased recall of precautions             General Comments        Exercises       Shoulder Instructions      Home Living Family/patient expects to be discharged to:: Skilled nursing facility Living Arrangements: Alone                                      Prior Functioning/Environment Level of Independence: Independent with assistive device(s)        Comments: pt used a rollator PTA    OT Diagnosis: Acute pain;Generalized weakness   OT Problem List: Pain;Decreased strength;Decreased activity tolerance;Decreased knowledge of precautions;Decreased knowledge of use of DME or AE   OT Treatment/Interventions:      OT Goals(Current goals can be found in the care plan section)    OT Frequency:     Barriers to D/C:  Co-evaluation              End of Session    Activity Tolerance:   Patient left:     Time: 9373-4287 OT Time Calculation (min): 20 min Charges:  OT General Charges $OT Visit: 1 Procedure OT Evaluation $Initial OT Evaluation Tier I: 1 Procedure G-Codes:    Hortencia Pilar 12-27-13, 3:28 PM

## 2013-12-13 NOTE — Progress Notes (Signed)
Agree with SPT.    Rosely Fernandez, PT [phone]  

## 2013-12-14 LAB — BODY FLUID CULTURE: CULTURE: NO GROWTH

## 2013-12-14 LAB — CBC
HCT: 30.1 % — ABNORMAL LOW (ref 36.0–46.0)
Hemoglobin: 9.6 g/dL — ABNORMAL LOW (ref 12.0–15.0)
MCH: 27.6 pg (ref 26.0–34.0)
MCHC: 31.9 g/dL (ref 30.0–36.0)
MCV: 86.5 fL (ref 78.0–100.0)
Platelets: 234 10*3/uL (ref 150–400)
RBC: 3.48 MIL/uL — ABNORMAL LOW (ref 3.87–5.11)
RDW: 12.9 % (ref 11.5–15.5)
WBC: 12.2 10*3/uL — ABNORMAL HIGH (ref 4.0–10.5)
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
## 2013-12-14 NOTE — Progress Notes (Signed)
Physical Therapy Treatment Patient Details Name: Pamela Palmer MRN: [id number] DOB: May 29, 1986 Today's Date: 12/14/2013    History of Present Illness pt presents with L THRevision with original THA ~67yrs ago per pt.  Hx of CP.     PT Comments    Pt was able to ambulate ~200 ft with RW and min assist for balance and safety due to fatigue.  Pt required multiple standing rests for her bil. UE.  Pt continues to need min guard for sit to/from stand transfers for safety due to fatigue and LE weakness.  Will continue to follow.   Follow Up Recommendations  SNF     Equipment Recommendations  None recommended by PT    Recommendations for Other Services       Precautions / Restrictions Precautions Precautions: Posterior Hip;Fall Precaution Comments: Pt able to recall 2/3 precautions, precautions reviewed with pt. Restrictions Weight Bearing Restrictions: Yes LLE Weight Bearing: Weight bearing as tolerated    Mobility  Bed Mobility               General bed mobility comments: Not assessed- Pt in chair upon PT arrival  Transfers Overall transfer level: Needs assistance Equipment used: Rolling walker (2 wheeled) Transfers: Sit to/from Stand Sit to Stand: Min guard         General transfer comment: Pt required VC to scoot forward in chair before rising to stand.  Min guard for safety with balance during slow rise to standing and decreased eccentric control with return to sitting.  Ambulation/Gait Ambulation/Gait assistance: Min assist Ambulation Distance (Feet): 200 Feet Assistive device: Rolling walker (2 wheeled) Gait Pattern/deviations: Step-through pattern;Decreased stance time - left;Decreased step length - right;Decreased weight shift to left   Gait velocity interpretation: Below normal speed for age/gender General Gait Details: Pt required multiple standing rests due to bil. UE fatigue with use of RW.  Min-A provided for safety with balance and UE  fatigue.   Stairs            Wheelchair Mobility    Modified Rankin (Stroke Patients Only)       Balance Overall balance assessment: Needs assistance Sitting-balance support: Bilateral upper extremity supported;Feet supported Sitting balance-Leahy Scale: Poor Sitting balance - Comments: Pt required assistance with weight shifting in sitting to don clothing after her bath.   Standing balance support: Bilateral upper extremity supported Standing balance-Leahy Scale: Poor Standing balance comment: Pt maintained at least single UE on RW for support throughout standing balance.  Majority of standing with bil. UE support.                    Cognition Arousal/Alertness: Awake/alert Behavior During Therapy: WFL for tasks assessed/performed Overall Cognitive Status: Within Functional Limits for tasks assessed       Memory: Decreased recall of precautions              Exercises      General Comments General comments (skin integrity, edema, etc.): Pt reported feeling flushed with racing heart upon PT arrival.  Seated BP at that time was 119/69 with HR of 124 bpm.  Pt also reported "bumps" on her L LE medical aspect below the knee.  Nurse was informed and came into room to check skin.      Pertinent Vitals/Pain Pt reported 7/10 pain in her back, which she believes to be arthritis.  Pt premedicated.    Home Living  Prior Function            PT Goals (current goals can now be found in the care plan section) Acute Rehab PT Goals Patient Stated Goal: Get better PT Goal Formulation: With patient Time For Goal Achievement: 12/26/13 Potential to Achieve Goals: Good    Frequency  7X/week    PT Plan Current plan remains appropriate    Co-evaluation             End of Session Equipment Utilized During Treatment: Gait belt Activity Tolerance: Patient limited by fatigue Patient left: in chair;with call bell/phone within  reach     Time: 1020-1055 PT Time Calculation (min): 35 min  Charges:                       G Codes:      Rahn Lacuesta, SPT 12/14/2013, 1:13 PM

## 2013-12-14 NOTE — Discharge Planning (Signed)
Report called to [place] at [facility].

## 2013-12-14 NOTE — Progress Notes (Signed)
Patient ID: MARKEE REMLINGER, female   DOB: Aug 26, 1985, 28 y.o.   MRN: [id number] PATIENT ID: EILIDH MARCANO  MRN: [id number]  DOB/AGE:  16-May-1986 / 28 y.o.  3 Days Post-Op Procedure(s) (LRB): TOTAL HIP REVISION (Left)    PROGRESS NOTE Subjective: Patient is alert, oriented,no Nausea, no Vomiting, yes passing gas, no Bowel Movement. Taking PO well. Denies SOB, Chest or Calf Pain. Using Incentive Spirometer, PAS in place. Ambulate 30 ft Patient reports pain as 3 on 0-10 scale  .    Objective: Vital signs in last 24 hours: Filed Vitals:   12/13/13 0800 12/13/13 1200 12/13/13 1327 12/13/13 2057  BP:   102/57 108/59  Pulse:   123 120  Temp:   98.9 F (37.2 C) 99.1 F (37.3 C)  TempSrc:   Oral Oral  Resp: 16 16 16 18   Weight:      SpO2: 100% 100% 100% 100%      Intake/Output from previous day: I/O last 3 completed shifts: In: 360 [P.O.:360] Out: 120 [Urine:120]   Intake/Output this shift:     LABORATORY DATA:  Recent Labs  12/12/13 0517 12/13/13 0610  WBC 13.7* 15.1*  HGB 10.0* 9.6*  HCT 30.7* 30.1*  PLT 227 225  NA 132*  --   K 3.8  --   CL 98  --   CO2 20  --   BUN 5*  --   CREATININE 0.50  --   GLUCOSE 173*  --   CALCIUM 8.4  --     Examination: Neurologically intact ABD soft Neurovascular intact Sensation intact distally Intact pulses distally Dorsiflexion/Plantar flexion intact Incision: scant drainage No cellulitis present Compartment soft} XR AP&Lat of hip shows well placed\fixed THA  Assessment:   3 Days Post-Op Procedure(s) (LRB): TOTAL HIP REVISION (Left) ADDITIONAL DIAGNOSIS:  hypothyroid  Plan: PT/OT WBAT, THA  posterior precautions  DVT Prophylaxis: SCDx72 hrs, ASA 325 mg BID x 2 weeks  DISCHARGE PLAN: Skilled Nursing Facility/Rehab, when facility available, today is ok  DISCHARGE NEEDS: HHPT, HHRN, CPM, Walker and 3-in-1 comode seat

## 2013-12-14 NOTE — Progress Notes (Signed)
Agree with SPT.    Arval Brandstetter, PT [phone]  

## 2013-12-14 NOTE — Care Management Note (Signed)
CARE MANAGEMENT NOTE 12/14/2013  Patient:  Pamela Palmer, Pamela Palmer   Account Number:  [id number]  Date Initiated:  12/12/2013  Documentation initiated by:  Ricki Miller  Subjective/Objective Assessment:   28 yr old female s/p left hip revision. Patient has hx. of cerebral palsy.     Action/Plan:   Patient is not able to care for self at home, fiance works and unable to be available. Social worker is aware of need [facility] SNF placement.  Patient going to [facility].   Anticipated DC Date:  12/14/2013   Anticipated DC Plan:  SKILLED NURSING FACILITY  In-house referral  Clinical Social Worker      DC Planning Services  CM consult      Choice offered to / List presented to:             Status of service:  Completed, signed off Medicare Important Message given?   (If response is "NO", the following Medicare IM given date fields will be blank) Date Medicare IM given:   Date Additional Medicare IM given:    Discharge Disposition:  [place]

## 2013-12-15 ENCOUNTER — Non-Acute Institutional Stay (SKILLED_NURSING_FACILITY): Payer: Medicaid Other | Admitting: Internal Medicine

## 2013-12-15 DIAGNOSIS — K219 Gastro-esophageal reflux disease without esophagitis: Secondary | ICD-10-CM

## 2013-12-15 DIAGNOSIS — J45909 Unspecified asthma, uncomplicated: Secondary | ICD-10-CM

## 2013-12-15 DIAGNOSIS — Z96649 Presence of unspecified artificial hip joint: Secondary | ICD-10-CM

## 2013-12-15 DIAGNOSIS — B370 Candidal stomatitis: Secondary | ICD-10-CM

## 2013-12-15 DIAGNOSIS — T8484XA Pain due to internal orthopedic prosthetic devices, implants and grafts, initial encounter: Secondary | ICD-10-CM

## 2013-12-15 DIAGNOSIS — T8489XA Other specified complication of internal orthopedic prosthetic devices, implants and grafts, initial encounter: Secondary | ICD-10-CM

## 2013-12-15 DIAGNOSIS — E039 Hypothyroidism, unspecified: Secondary | ICD-10-CM

## 2013-12-15 DIAGNOSIS — B3781 Candidal esophagitis: Secondary | ICD-10-CM

## 2013-12-15 DIAGNOSIS — J309 Allergic rhinitis, unspecified: Secondary | ICD-10-CM

## 2013-12-15 DIAGNOSIS — G808 Other cerebral palsy: Secondary | ICD-10-CM

## 2013-12-15 DIAGNOSIS — F4310 Post-traumatic stress disorder, unspecified: Secondary | ICD-10-CM

## 2013-12-15 LAB — TYPE AND SCREEN
ABO/RH(D): B POS
Antibody Screen: NEGATIVE
UNIT DIVISION: 0
Unit division: 0

## 2013-12-16 LAB — ANAEROBIC CULTURE: Gram Stain: NONE SEEN

## 2013-12-18 ENCOUNTER — Encounter: Payer: Self-pay | Admitting: Internal Medicine

## 2013-12-18 DIAGNOSIS — K219 Gastro-esophageal reflux disease without esophagitis: Secondary | ICD-10-CM | POA: Insufficient documentation

## 2013-12-18 DIAGNOSIS — E039 Hypothyroidism, unspecified: Secondary | ICD-10-CM | POA: Insufficient documentation

## 2013-12-18 DIAGNOSIS — J309 Allergic rhinitis, unspecified: Secondary | ICD-10-CM | POA: Insufficient documentation

## 2013-12-18 DIAGNOSIS — J45909 Unspecified asthma, uncomplicated: Secondary | ICD-10-CM | POA: Insufficient documentation

## 2013-12-18 DIAGNOSIS — F4310 Post-traumatic stress disorder, unspecified: Secondary | ICD-10-CM | POA: Insufficient documentation
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
## 2013-12-22 ENCOUNTER — Non-Acute Institutional Stay (SKILLED_NURSING_FACILITY): Payer: Medicaid Other | Admitting: Internal Medicine

## 2013-12-22 DIAGNOSIS — T8484XA Pain due to internal orthopedic prosthetic devices, implants and grafts, initial encounter: Secondary | ICD-10-CM

## 2013-12-22 DIAGNOSIS — Z96649 Presence of unspecified artificial hip joint: Secondary | ICD-10-CM

## 2013-12-22 DIAGNOSIS — T8489XA Other specified complication of internal orthopedic prosthetic devices, implants and grafts, initial encounter: Secondary | ICD-10-CM

## 2013-12-22 NOTE — Progress Notes (Signed)
Patient ID: Pamela Palmer, female   DOB: 05/04/1986, 28 y.o.   MRN: [id number]    [facility]  Chief Complaint  Patient presents with  . Acute Visit    pain medication management   No Known Allergies  HPI 28 y/o female patient with h/o infantile cerebral palsy, hypothyroidism, asthma is here for STR after left hip revision. She is walking around with her walker and is in no distress. She mentions that the current oxycodone-apap is making her head feel funny. She would like it to be changed to something else. Her pain appears to be under control no other concerns. [facility] s/p hospitalization for left total hip revision by Dr. Frederik Pear on 12/11/13.    ROS: Constitutional: Negative for fever and malaise/fatigue.  Cardiovascular: Negative for chest pain and leg swelling.  Gastrointestinal: Negative for nausea, vomiting and constipation.        Genitourinary: Negative for dysuria, urgency and frequency.  Musculoskeletal: Positive for joint pain. Negative for falls.  Skin: Negative for rash.   Medication reviewed. See [facility]  Physical exam BP 120/78  Pulse 80  Resp 16  LMP 11/23/2013  Constitutional: She is oriented to person, place, and time. She appears well-developed and well-nourished. No distress.  Eyes: Conjunctivae and EOM are normal.  Cardiovascular: Normal rate, regular rhythm, normal heart sounds and intact distal pulses.   Pulmonary/Chest: Effort normal and breath sounds normal. No respiratory distress.  Musculoskeletal: Normal range of motion. She exhibits no edema.  Neurological: She is alert and oriented to person, place, and time.  Skin: Skin is warm and dry. Incision site healing well, staples in place Psychiatric: She has a normal mood and affect.   Assessment.plan  Pain due to hip joint prosthesis left Well controlled pain. Has good ROM and mobile in the facility. Will d/c roxicet and have her on tramadol  50 mg 1 tab q6h for moderate pain and 2 tab q6h prn for severe pain. continue robaxin for now.

## 2014-01-04 ENCOUNTER — Non-Acute Institutional Stay (SKILLED_NURSING_FACILITY): Payer: Medicaid Other | Admitting: Internal Medicine

## 2014-01-04 DIAGNOSIS — J45909 Unspecified asthma, uncomplicated: Secondary | ICD-10-CM

## 2014-01-04 DIAGNOSIS — K219 Gastro-esophageal reflux disease without esophagitis: Secondary | ICD-10-CM

## 2014-01-04 DIAGNOSIS — E039 Hypothyroidism, unspecified: Secondary | ICD-10-CM

## 2014-01-04 DIAGNOSIS — G808 Other cerebral palsy: Secondary | ICD-10-CM

## 2014-01-04 DIAGNOSIS — Z96649 Presence of unspecified artificial hip joint: Secondary | ICD-10-CM

## 2014-01-04 DIAGNOSIS — M25559 Pain in unspecified hip: Secondary | ICD-10-CM

## 2014-01-04 DIAGNOSIS — M25552 Pain in left hip: Secondary | ICD-10-CM
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
## 2014-01-16 ENCOUNTER — Encounter: Payer: Self-pay | Admitting: Neurology

## 2014-01-19 NOTE — OR Nursing (Signed)
Addendum to scope page 

## 2014-02-26 ENCOUNTER — Other Ambulatory Visit: Payer: Self-pay | Admitting: Internal Medicine

## 2014-03-21 ENCOUNTER — Encounter: Payer: Self-pay | Admitting: Neurology

## 2014-03-21 ENCOUNTER — Ambulatory Visit (INDEPENDENT_AMBULATORY_CARE_PROVIDER_SITE_OTHER): Payer: Medicaid Other | Admitting: Neurology

## 2014-03-21 ENCOUNTER — Ambulatory Visit: Payer: Medicaid Other | Admitting: Neurology

## 2014-03-21 VITALS — BP 118/75 | HR 88 | Ht 67.0 in | Wt 195.0 lb

## 2014-03-21 DIAGNOSIS — R1310 Dysphagia, unspecified: Secondary | ICD-10-CM

## 2014-03-21 NOTE — Patient Instructions (Signed)
-   referral to swallow evaluation and treatment - repeat some blood test today to evaluaton - follow up with Dr. Leonie Man in about one month.

## 2014-03-21 NOTE — Progress Notes (Signed)
NEUROLOGY FOLLOW UP NOTE  NAME: Pamela Palmer DOB: 1985-09-25  REASON FOR VISIT: stroke follow up HISTORY FROM: chart and pt  Today we had the pleasure of seeing Pamela Palmer in follow-up at our Neurology Clinic. Pt was accompanied by no one.   History Summary  10/03/13 - first visit 28 year old African American lady with dysphagia since last 6 months. History is obtained from the patient and referral notes. She states that this is gradual onset and not progressive. She is able to initiate swallowing but feels that food gets stuck in her throat. She has had to chew food into small pieces in order to keep. She can eat soft foods only. She has noted decreased appetite and she's lost 10 pounds for the last 6 months. She also states that her voice seems to fluctuate at times it is deep and it is soft. She has notice increasing dysphagia particularly at night with occasional sharp and burning pain in her throat. She states she's had swallow eval done twice but review of records did not reveal any reports. On inquiry she admits to getting tired easily and having less stamina. She denies accompanying neurological symptoms in the form of droopy eyelids, diurnal fluctuation in her symptoms, muscle fatigue, diplopia, jaw dropping while eating or extremity weakness. She was seen in [facility] emergency room recently and had CBC and BMP which were normal. 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 She uses a walker for long distances and can walk short distances without help. She vent is present special needs classes and finished high school. She is on disability. 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 She denies any worsening of her baseline gait difficulties or speech problems along with thi dysphagia over the last 6 months.  Interval History During the  interval time, the patient has been doing the same. She stated that she is still on mostly soft food, if solid, she has to cut into small pieces. She feels the food goes down slowly and seems like easy to stuck in the throat. She sometimes has painful feeling in throat even without food. She lost 11lbs for the last 6 months. She has not get any swallow evaluation since last visit. Nobody calling her for any swallow eval.   She had left hip surgery done on 5/8 and after the surgery she feels much better and her gait is better. She does not have other complains and request to go over her test results.    REVIEW OF SYSTEMS: Full 14 system review of systems performed and notable only for those listed below and in HPI above, all others are negative:  Constitutional: N/A  Cardiovascular: N/A  Ear/Nose/Throat:  Ear pain, ringing in ears, trouble swallowing Skin: N/A  Eyes: blurry vision Respiratory: N/A  Gastroitestinal: N/A  Genitourinary: N/A Hematology/Lymphatic: N/A  Endocrine: N/A  Musculoskeletal: N/A  Allergy/Immunology: N/A  Neurological: dizziness, headache  Psychiatric: N/A  The following represents the patient's updated allergies and side effects list: No Known Allergies  Labs since last visit of relevance include the following: Results for orders placed during the hospital encounter of 12/11/13  ANAEROBIC CULTURE      Result Value Ref Range   Specimen Description SYNOVIAL FLUID HIP LEFT     Special Requests FLUID ON SWAB PT ON ANCEF  Gram Stain       Value: NO WBC SEEN     NO ORGANISMS SEEN     Performed at [facility]   Culture       Value: NO ANAEROBES ISOLATED     Performed at [facility]   Report Status 12/16/2013 FINAL    BODY FLUID CULTURE      Result Value Ref Range   Specimen Description SYNOVIAL FLUID HIP LEFT     Special Requests FLUID ON SWAB PT ON ANCEF     Gram Stain       Value: RARE WBC PRESENT,BOTH PMN AND MONONUCLEAR     NO  ORGANISMS SEEN     Performed at [facility]   Culture       Value: NO GROWTH 3 DAYS     Performed at [facility]   Report Status 12/14/2013 FINAL    CBC      Result Value Ref Range   WBC 13.7 (*) 4.0 - 10.5 K/uL   RBC 3.63 (*) 3.87 - 5.11 MIL/uL   Hemoglobin 10.0 (*) 12.0 - 15.0 g/dL   HCT 30.7 (*) 36.0 - 46.0 %   MCV 84.6  78.0 - 100.0 fL   MCH 27.5  26.0 - 34.0 pg   MCHC 32.6  30.0 - 36.0 g/dL   RDW 12.6  11.5 - 15.5 %   Platelets 227  150 - 400 K/uL  BASIC METABOLIC PANEL      Result Value Ref Range   Sodium 132 (*) 137 - 147 mEq/L   Potassium 3.8  3.7 - 5.3 mEq/L   Chloride 98  96 - 112 mEq/L   CO2 20  19 - 32 mEq/L   Glucose, Bld 173 (*) 70 - 99 mg/dL   BUN 5 (*) 6 - 23 mg/dL   Creatinine, Ser 0.50  0.50 - 1.10 mg/dL   Calcium 8.4  8.4 - 10.5 mg/dL   GFR calc non Af Amer >90  >90 mL/min   GFR calc Af Amer >90  >90 mL/min  HEMOGLOBIN AND HEMATOCRIT, BLOOD      Result Value Ref Range   Hemoglobin 10.9 (*) 12.0 - 15.0 g/dL   HCT 33.5 (*) 36.0 - 46.0 %  CBC      Result Value Ref Range   WBC 15.1 (*) 4.0 - 10.5 K/uL   RBC 3.53 (*) 3.87 - 5.11 MIL/uL   Hemoglobin 9.6 (*) 12.0 - 15.0 g/dL   HCT 30.1 (*) 36.0 - 46.0 %   MCV 85.3  78.0 - 100.0 fL   MCH 27.2  26.0 - 34.0 pg   MCHC 31.9  30.0 - 36.0 g/dL   RDW 13.0  11.5 - 15.5 %   Platelets 225  150 - 400 K/uL  CBC      Result Value Ref Range   WBC 12.2 (*) 4.0 - 10.5 K/uL   RBC 3.48 (*) 3.87 - 5.11 MIL/uL   Hemoglobin 9.6 (*) 12.0 - 15.0 g/dL   HCT 30.1 (*) 36.0 - 46.0 %   MCV 86.5  78.0 - 100.0 fL   MCH 27.6  26.0 - 34.0 pg   MCHC 31.9  30.0 - 36.0 g/dL   RDW 12.9  11.5 - 15.5 %   Platelets 234  150 - 400 K/uL  PREPARE RBC (CROSSMATCH)      Result Value Ref Range   Order Confirmation ORDER PROCESSED BY BLOOD BANK    TYPE  AND SCREEN      Result Value Ref Range   ABO/RH(D) B POS     Antibody Screen NEG     Sample Expiration 12/14/2013     Unit Number [id number]     Blood Component Type  RED CELLS,LR     Unit division 00     Status of Unit REL FROM [facility]     Transfusion Status OK TO TRANSFUSE     Crossmatch Result Compatible     Unit Number [id number]     Blood Component Type RED CELLS,LR     Unit division 00     Status of Unit REL FROM [facility]     Transfusion Status OK TO TRANSFUSE     Crossmatch Result Compatible      The neurologically relevant items on the patient's problem list were reviewed on today's visit.  Neurologic Examination  A problem focused neurological exam (12 or more points of the single system neurologic examination, vital signs counts as 1 point, cranial nerves count for 8 points) was performed.  Blood pressure 118/75, pulse 88, height 5\' 7"  (1.702 m), weight 195 lb (88.451 kg).  General - Well nourished, well developed, in no apparent distress.  Ophthalmologic - Sharp disc margins OU.  Cardiovascular - Regular rate and rhythm with no murmur.  Mental Status -  Level of arousal and orientation to time, place, and person were intact. Language including expression, naming, repetition, comprehension, reading, and writing was assessed and found intact.  Cranial Nerves II - XII - II - Visual field intact OU. III, IV, VI - Extraocular movements intact. V - Facial sensation intact bilaterally. VII - Facial movement intact bilaterally. VIII - Hearing & vestibular intact bilaterally. X - Palate elevates symmetrically. XI - Chin turning & shoulder shrug intact bilaterally. XII - Tongue protrusion intact.  Motor Strength - The patient's strength was normal in all extremities and pronator drift was absent.  Bulk was normal and fasciculations were absent.   Motor Tone - Muscle tone was assessed at the neck and appendages and was normal.  Reflexes - The patient's reflexes were normal in all extremities and she had no pathological reflexes.  Sensory - Light touch, temperature/pinprick, vibration and proprioception, and Romberg testing were assessed  and were normal.    Coordination - The patient had normal movements in the hands and feet with no ataxia or dysmetria.  Tremor was absent.  Gait and Station - The patient's transfers, posture, gait, station, and turns were observed as normal.  Data reviewed: I personally reviewed the images and agree with the radiology interpretations.  MRI head and cervical spine - unremarkable EMG/NCS - unremarkable  Component     Latest Ref Rng 10/03/2013  TSH     0.450 - 4.500 uIU/mL 0.567  T4, Total     4.5 - 12.0 ug/dL 12.3 (H)  T3 Uptake Ratio     24 - 39 % 27  Free Thyroxine Index     1.2 - 4.9 3.3  AChR Binding Ab, Serum     0.00 - 0.24 nmol/L <0.03  AChR Blocking Abs, Serum     0 - 25 % 29 (H)  Acetylcholine Rec Mod Ab     0 - 20 % <12  Sed Rate     0 - 32 mm/hr 6  ANA     Negative Positive (A)  Vitamin B-12     211 - 946 pg/mL 603    Assessment: As you may recall,  she is a 28 y.o. African American female with PMH of cerebral palsy with left hip pain s/p hip surgery follow up in clinic for her complain of subacute dysphagia. Neuro exam did not have positive findings. Her studies so far largely negative. MRI neg and EMG/NCS ruled out MG. Her ANA was tested positive but no titer. Total T4 high but TSH normal. Her Ach binding and modulating Abs neg, but blocking ab slightly high, I am not too concerns about that especially EMG neg and her symptoms not consistent with MG. I highly suspect functional component for her symptoms. But would like to repeat ANA titer, TSH and free T4 and Ach blocking ab, but most importantly get swallow eval and treatment.  Plan:  - again refer to swallow evaluation and treatment - repeat ANA with titer, Ach blocking ab as well as TSH and free T4 - highly suspect functional component of her symptoms - depends on swallow eval, may consider psychological referral if all repeating test negative - follow up with Dr. Leonie Man in one month.  Orders Placed This  Encounter  Procedures  . ANA    Please do titer too. Pt had positive test last time but no titer. Thanks.  . Acetylcholine receptor, blocking  . TSH + free T4  . SLP eval and treat    Standing Status: Future     Number of Occurrences:      Standing Expiration Date: 03/22/2015    Scheduling Instructions:     Swallow evaluation and treat. Thanks.    Patient Instructions  - referral to swallow evaluation and treatment - repeat some blood test today to evaluaton - follow up with Dr. Leonie Man in about one month.   Rosalin Hawking, MD PhD [facility] [address], [place], [place] [address] [phone]

## 2014-03-22 LAB — SPECIMEN STATUS REPORT

## 2014-03-27 LAB — ANA: ANA: POSITIVE — AB

## 2014-03-27 LAB — TSH+FREE T4
Free T4: 1.45 ng/dL (ref 0.82–1.77)
TSH: 1.68 u[IU]/mL (ref 0.450–4.500)

## 2014-03-27 LAB — ACETYLCHOLINE RECEPTOR, BLOCKING: AChR Blocking Abs, Serum: 17 % (ref 0–25)

## 2014-03-31 ENCOUNTER — Other Ambulatory Visit: Payer: Self-pay | Admitting: Internal Medicine

## 2014-04-06 ENCOUNTER — Other Ambulatory Visit: Payer: Self-pay | Admitting: Internal Medicine

## 2014-04-06 LAB — SPECIMEN STATUS REPORT

## 2014-04-14 LAB — ANA COMPREHENSIVE PANEL
Anti JO-1: <0.2 AI (ref 0.0–0.9)
Centromere Ab Screen: <0.2 AI (ref 0.0–0.9)
Chromatin Ab SerPl-aCnc: <0.2 AI (ref 0.0–0.9)
ENA RNP AB: 2.1 AI — AB (ref 0.0–0.9)
ENA SSA (RO) Ab: <0.2 AI (ref 0.0–0.9)
Scleroderma SCL-70: <0.2 AI (ref 0.0–0.9)

## 2014-04-14 LAB — ANTINUCLEAR ANTIBODIES, IFA: ANA Titer 1: NEGATIVE

## 2014-04-14 LAB — SPECIMEN STATUS REPORT

## 2014-05-02 ENCOUNTER — Encounter (INDEPENDENT_AMBULATORY_CARE_PROVIDER_SITE_OTHER): Payer: Self-pay

## 2014-05-02 ENCOUNTER — Ambulatory Visit (INDEPENDENT_AMBULATORY_CARE_PROVIDER_SITE_OTHER): Payer: Medicaid Other | Admitting: Neurology

## 2014-05-02 ENCOUNTER — Encounter: Payer: Self-pay | Admitting: Neurology

## 2014-05-02 VITALS — BP 124/72 | HR 73 | Wt 194.2 lb

## 2014-05-02 DIAGNOSIS — R1310 Dysphagia, unspecified: Secondary | ICD-10-CM

## 2014-05-02 NOTE — Progress Notes (Signed)
NEUROLOGY FOLLOW UP NOTE  NAME: ALAISA Palmer DOB: 1985/09/04  REASON FOR VISIT: stroke follow up HISTORY FROM: chart and pt  Today we had the pleasure of seeing Pamela Palmer in follow-up at our Neurology Clinic. Pt was accompanied by no one.   History Summary  10/03/13 - first visit 28 year old African American lady with dysphagia since last 6 months. History is obtained from the patient and referral notes. She states that this is gradual onset and not progressive. She is able to initiate swallowing but feels that food gets stuck in her throat. She has had to chew food into small pieces in order to keep. She can eat soft foods only. She has noted decreased appetite and she's lost 10 pounds for the last 6 months. She also states that her voice seems to fluctuate at times it is deep and it is soft. She has notice increasing dysphagia particularly at night with occasional sharp and burning pain in her throat. She states she's had swallow eval done twice but review of records did not reveal any reports. On inquiry she admits to getting tired easily and having less stamina. She denies accompanying neurological symptoms in the form of droopy eyelids, diurnal fluctuation in her symptoms, muscle fatigue, diplopia, jaw dropping while eating or extremity weakness. She was seen in [facility] emergency room recently and had CBC and BMP which were normal. 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 She uses a walker for long distances and can walk short distances without help. She vent is present special needs classes and finished high school. She is on disability. 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 She denies any worsening of her baseline gait difficulties or speech problems along with thi dysphagia over the last 6 months.  03/21/14 follow up - the patient  has been doing the same. She stated that she is still on mostly soft food, if solid, she has to cut into small pieces. She feels the food goes down slowly and seems like easy to stuck in the throat. She sometimes has painful feeling in throat even without food.    Interval History During the interval time, she still doing the same. She stated that she feels painful when she swallows, more frequently than before. She has not had swallow evaluation and treatment yet. She had repeat AchR blocking antibody which was negative. However, she had positive ENA RNP ab. Will recommend rheumatology referral.  REVIEW OF SYSTEMS: Full 14 system review of systems performed and notable only for those listed below and in HPI above, all others are negative:  Constitutional: N/A  Cardiovascular: N/A  Ear/Nose/Throat:  Ear pain, ringing in ears, trouble swallowing Skin: N/A  Eyes: blurry vision, light sensitivity Respiratory: N/A  Gastroitestinal: N/A  Genitourinary: N/A Hematology/Lymphatic: N/A  Endocrine: N/A  Musculoskeletal: Neck pain, neck stiffness  Allergy/Immunology: N/A  Neurological: dizziness, headache  Psychiatric: N/A  The following represents the patient's updated allergies and side effects list: No Known Allergies  Labs since last visit of relevance include the following: Results for orders placed in visit on 03/21/14  ANA      Result Value Ref Range   ANA Positive (*) Negative  ACETYLCHOLINE RECEPTOR, BLOCKING      Result Value Ref Range   AChR Blocking Abs, Serum 17  0 - 25 %  TSH+FREE T4      Result Value Ref Range   TSH 1.680  0.450 - 4.500 uIU/mL   Free T4 1.45  0.82 - 1.77 ng/dL  SPECIMEN STATUS REPORT      Result Value Ref Range   specimen status report Comment    SPECIMEN STATUS REPORT      Result Value Ref Range   specimen status report Comment    ANA COMPREHENSIVE PANEL      Result Value Ref Range   dsDNA Ab <1  0 - 9 IU/mL   ENA RNP Ab 2.1 (*) 0.0 - 0.9 AI   ENA  SM Ab Ser-aCnc <0.2  0.0 - 0.9 AI   Scleroderma SCL-70 <0.2  0.0 - 0.9 AI   ENA SSA (RO) Ab <0.2  0.0 - 0.9 AI   ENA SSB (LA) Ab <0.2  0.0 - 0.9 AI   Chromatin Ab SerPl-aCnc <0.2  0.0 - 0.9 AI   Anti JO-1 <0.2  0.0 - 0.9 AI   Centromere Ab Screen <0.2  0.0 - 0.9 AI   See below: Comment    ANTINUCLEAR ANTIBODIES, IFA      Result Value Ref Range   ANA Titer 1 Negative    SPECIMEN STATUS REPORT      Result Value Ref Range   specimen status report Comment      The neurologically relevant items on the patient's problem list were reviewed on today's visit.  Neurologic Examination  A problem focused neurological exam (12 or more points of the single system neurologic examination, vital signs counts as 1 point, cranial nerves count for 8 points) was performed.  Blood pressure 124/72, pulse 73, weight 194 lb 3.2 oz (88.089 kg).  General - Well nourished, well developed, in no apparent distress.  Ophthalmologic - Sharp disc margins OU.  Cardiovascular - Regular rate and rhythm with no murmur.  Mental Status -  Level of arousal and orientation to time, place, and person were intact. Language including expression, naming, repetition, comprehension, reading, and writing was assessed and found intact.  Cranial Nerves II - XII - II - Visual field intact OU. III, IV, VI - Extraocular movements intact. V - Facial sensation intact bilaterally. VII - Facial movement intact bilaterally. VIII - Hearing & vestibular intact bilaterally. X - Palate elevates symmetrically. XI - Chin turning & shoulder shrug intact bilaterally. XII - Tongue protrusion intact.  Motor Strength - The patient's strength was normal in all extremities and pronator drift was absent.  Bulk was normal and fasciculations were absent.   Motor Tone - Muscle tone was assessed at the neck and appendages and was normal.  Reflexes - The patient's reflexes were normal in all extremities and she had no pathological  reflexes.  Sensory - Light touch, temperature/pinprick, vibration and proprioception, and Romberg testing were assessed and were normal.    Coordination - The patient had normal movements in the hands and feet with no ataxia or dysmetria.  Tremor was absent.  Gait and Station - The patient's transfers, posture, gait, station, and turns were observed as normal.  Data reviewed: I personally reviewed the images and agree with the radiology interpretations.  MRI head and cervical spine - unremarkable EMG/NCS - unremarkable  Component     Latest Ref Rng 10/03/2013  TSH     0.450 - 4.500 uIU/mL 0.567  T4, Total     4.5 - 12.0 ug/dL 12.3 (H)  T3 Uptake Ratio     24 -  39 % 27  Free Thyroxine Index     1.2 - 4.9 3.3  AChR Binding Ab, Serum     0.00 - 0.24 nmol/L <0.03  AChR Blocking Abs, Serum     0 - 25 % 29 (H)  Acetylcholine Rec Mod Ab     0 - 20 % <12  Sed Rate     0 - 32 mm/hr 6  ANA     Negative Positive (A)  Vitamin B-12     211 - 946 pg/mL 603    Component     Latest Ref Rng 03/21/2014  dsDNA Ab     0 - 9 IU/mL <1  ENA RNP Ab     0.0 - 0.9 AI 2.1 (H)  ENA SM Ab Ser-aCnc     0.0 - 0.9 AI <0.2  Scleroderma SCL-70     0.0 - 0.9 AI <0.2  ENA SSA (RO) Ab     0.0 - 0.9 AI <0.2  ENA SSB (LA) Ab     0.0 - 0.9 AI <0.2  Chromatin Ab SerPl-aCnc     0.0 - 0.9 AI <0.2  Anti JO-1     0.0 - 0.9 AI <0.2  Centromere Ab Screen     0.0 - 0.9 AI <0.2  SEE BELOW      Comment  TSH     0.450 - 4.500 uIU/mL 1.680  Free T4     0.82 - 1.77 ng/dL 1.45  ANA     Negative Positive (A)  AChR Blocking Abs, Serum     0 - 25 % 17  ANA Titer 1      Negative    Assessment: As you may recall, she is a 28 y.o. African American female with PMH of cerebral palsy with left hip pain s/p hip surgery follow up in clinic for her complain of subacute dysphagia. Neuro exam did not have positive findings. Her studies so far largely negative. MRI neg and EMG/NCS ruled out MG. Total T4 high but  TSH normal. Her Ach binding and modulating Abs neg, and repeat blocking ab negative too. Autoimmune panel mostly negative only ENA RNP ab was positive. Still I highly suspect functional component for her symptoms. But repeat ANA was positive although titer was low, ENA RNP ab was positive, so rheumatology referral would be appropriate. Most importantly get swallow eval and treatment.  Plan:  - again refer to swallow evaluation and treatment - recommend to have rheumatology referral to rule out mixed connective tissue disease. - still suspect functional component of her symptoms - RTC in 3 months  Orders Placed This Encounter  Procedures  . Ambulatory referral to Speech Therapy    Referral Priority:  Routine    Referral Type:  Speech Therapy    Referral Reason:  Specialty Services Required    Requested Specialty:  Speech Pathology    Number of Visits Requested:  1    Patient Instructions  - will refer you for a swallow evaluation and treatment - will send message to your PCP for rheumatology consultation - continue current medication - follow up in 3 months.   Rosalin Hawking, MD PhD [facility] [address] [place], [place] [address] [phone]

## 2014-05-02 NOTE — Patient Instructions (Signed)
-   will refer you for a swallow evaluation and treatment - will send message to your PCP for rheumatology consultation - continue current medication - follow up in 3 months.

## 2014-05-07 ENCOUNTER — Telehealth: Payer: Self-pay | Admitting: *Deleted

## 2014-05-07 NOTE — Telephone Encounter (Signed)
Message copied by Joellen Jersey on Mon May 07, 2014 12:22 PM ------      Message from: Rosalin Hawking      Created: Wed May 02, 2014  5:30 PM       Could you please send the PCP a copy of this note and ask PCP to refer pt to a rheumatologist (I also put this part in the note too)? Thanks.  ------

## 2014-05-07 NOTE — Telephone Encounter (Signed)
Information sent on [id number]

## 2014-05-08 ENCOUNTER — Other Ambulatory Visit: Payer: Self-pay | Admitting: Neurology

## 2014-05-08 DIAGNOSIS — R1310 Dysphagia, unspecified: Secondary | ICD-10-CM

## 2014-05-30 ENCOUNTER — Other Ambulatory Visit: Payer: Self-pay | Admitting: Internal Medicine

## 2014-06-26 ENCOUNTER — Emergency Department (HOSPITAL_COMMUNITY): Payer: Medicaid Other

## 2014-06-26 ENCOUNTER — Encounter (HOSPITAL_COMMUNITY): Payer: Self-pay | Admitting: *Deleted

## 2014-06-26 ENCOUNTER — Emergency Department (HOSPITAL_COMMUNITY)
Admission: EM | Admit: 2014-06-26 | Discharge: 2014-06-26 | Disposition: A | Discharge status: Home / Self Care | Payer: Medicaid Other | Attending: Emergency Medicine | Admitting: Emergency Medicine

## 2014-06-26 DIAGNOSIS — K219 Gastro-esophageal reflux disease without esophagitis: Secondary | ICD-10-CM | POA: Insufficient documentation

## 2014-06-26 DIAGNOSIS — M79603 Pain in arm, unspecified: Secondary | ICD-10-CM

## 2014-06-26 DIAGNOSIS — Z8659 Personal history of other mental and behavioral disorders: Secondary | ICD-10-CM | POA: Insufficient documentation

## 2014-06-26 DIAGNOSIS — M25512 Pain in left shoulder: Secondary | ICD-10-CM

## 2014-06-26 DIAGNOSIS — M79641 Pain in right hand: Secondary | ICD-10-CM

## 2014-06-26 DIAGNOSIS — Z79899 Other long term (current) drug therapy: Secondary | ICD-10-CM | POA: Diagnosis not present

## 2014-06-26 DIAGNOSIS — M1990 Unspecified osteoarthritis, unspecified site: Secondary | ICD-10-CM | POA: Insufficient documentation

## 2014-06-26 DIAGNOSIS — E039 Hypothyroidism, unspecified: Secondary | ICD-10-CM | POA: Insufficient documentation

## 2014-06-26 DIAGNOSIS — Z8669 Personal history of other diseases of the nervous system and sense organs: Secondary | ICD-10-CM | POA: Insufficient documentation

## 2014-06-26 DIAGNOSIS — R531 Weakness: Secondary | ICD-10-CM | POA: Diagnosis present

## 2014-06-26 DIAGNOSIS — J45909 Unspecified asthma, uncomplicated: Secondary | ICD-10-CM | POA: Insufficient documentation

## 2014-06-26 DIAGNOSIS — Z7982 Long term (current) use of aspirin: Secondary | ICD-10-CM | POA: Diagnosis not present

## 2014-06-26 DIAGNOSIS — M79601 Pain in right arm: Secondary | ICD-10-CM

## 2014-06-26 DIAGNOSIS — R202 Paresthesia of skin: Secondary | ICD-10-CM

## 2014-06-26 DIAGNOSIS — M25511 Pain in right shoulder: Secondary | ICD-10-CM

## 2014-06-26 DIAGNOSIS — M79642 Pain in left hand: Secondary | ICD-10-CM

## 2014-06-26 LAB — I-STAT CHEM 8, ED
BUN: 6 mg/dL (ref 6–23)
CREATININE: 0.7 mg/dL (ref 0.50–1.10)
Calcium, Ion: 1.13 mmol/L (ref 1.12–1.23)
Chloride: 104 mEq/L (ref 96–112)
Glucose, Bld: 84 mg/dL (ref 70–99)
HEMATOCRIT: 38 % (ref 36.0–46.0)
HEMOGLOBIN: 12.9 g/dL (ref 12.0–15.0)
POTASSIUM: 3.6 meq/L — AB (ref 3.7–5.3)
SODIUM: 139 meq/L (ref 137–147)
TCO2: 21 mmol/L (ref 0–100)

## 2014-06-26 LAB — CBC WITH DIFFERENTIAL/PLATELET
Basophils Absolute: 0 10*3/uL (ref 0.0–0.1)
Basophils Relative: 0 % (ref 0–1)
Eosinophils Absolute: 0.1 10*3/uL (ref 0.0–0.7)
Eosinophils Relative: 1 % (ref 0–5)
HCT: 35.2 % — ABNORMAL LOW (ref 36.0–46.0)
HEMOGLOBIN: 11 g/dL — AB (ref 12.0–15.0)
LYMPHS ABS: 1.8 10*3/uL (ref 0.7–4.0)
LYMPHS PCT: 27 % (ref 12–46)
MCH: 25.6 pg — AB (ref 26.0–34.0)
MCHC: 31.3 g/dL (ref 30.0–36.0)
MCV: 81.9 fL (ref 78.0–100.0)
MONOS PCT: 7 % (ref 3–12)
Monocytes Absolute: 0.5 10*3/uL (ref 0.1–1.0)
NEUTROS ABS: 4.3 10*3/uL (ref 1.7–7.7)
NEUTROS PCT: 65 % (ref 43–77)
PLATELETS: 241 10*3/uL (ref 150–400)
RBC: 4.3 MIL/uL (ref 3.87–5.11)
RDW: 13.3 % (ref 11.5–15.5)
WBC: 6.7 10*3/uL (ref 4.0–10.5)

## 2014-06-26 LAB — SEDIMENTATION RATE: SED RATE: 22 mm/h (ref 0–22)

## 2014-06-26 NOTE — ED Notes (Signed)
Neurology at bedside with patient.

## 2014-06-26 NOTE — ED Notes (Signed)
Pt states that she has had generalized weakness and numbness that has been progressing over the last several weeks. No neuro deficits noted.
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
## 2014-06-26 NOTE — ED Notes (Signed)
Phlebotomy collect attempt x2, unsucessful. Will call phlebotomy

## 2014-07-13 ENCOUNTER — Other Ambulatory Visit: Payer: Self-pay | Admitting: Neurology

## 2014-07-13 DIAGNOSIS — R1310 Dysphagia, unspecified: Secondary | ICD-10-CM

## 2014-07-17 ENCOUNTER — Other Ambulatory Visit: Payer: Self-pay | Admitting: Neurology

## 2014-07-17 DIAGNOSIS — R1310 Dysphagia, unspecified: Secondary | ICD-10-CM

## 2014-07-19 ENCOUNTER — Other Ambulatory Visit (HOSPITAL_COMMUNITY): Payer: Self-pay | Admitting: Neurology

## 2014-07-19 DIAGNOSIS — R1310 Dysphagia, unspecified: Secondary | ICD-10-CM

## 2014-07-25 ENCOUNTER — Ambulatory Visit (HOSPITAL_COMMUNITY)
Admission: RE | Admit: 2014-07-25 | Discharge: 2014-07-25 | Disposition: A | Discharge status: Home / Self Care | Payer: Medicaid Other | Source: Ambulatory Visit | Attending: Neurology | Admitting: Neurology

## 2014-07-25 DIAGNOSIS — E039 Hypothyroidism, unspecified: Secondary | ICD-10-CM | POA: Insufficient documentation

## 2014-07-25 DIAGNOSIS — J45909 Unspecified asthma, uncomplicated: Secondary | ICD-10-CM

## 2014-07-25 DIAGNOSIS — R1310 Dysphagia, unspecified: Secondary | ICD-10-CM | POA: Insufficient documentation

## 2014-07-25 DIAGNOSIS — K219 Gastro-esophageal reflux disease without esophagitis: Secondary | ICD-10-CM

## 2014-07-25 DIAGNOSIS — G809 Cerebral palsy, unspecified: Secondary | ICD-10-CM | POA: Insufficient documentation

## 2014-07-25 DIAGNOSIS — R05 Cough: Secondary | ICD-10-CM | POA: Insufficient documentation

## 2014-07-25 DIAGNOSIS — F4310 Post-traumatic stress disorder, unspecified: Secondary | ICD-10-CM | POA: Insufficient documentation
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
## 2014-08-08 ENCOUNTER — Encounter: Payer: Self-pay | Admitting: Neurology

## 2014-08-08 ENCOUNTER — Ambulatory Visit (INDEPENDENT_AMBULATORY_CARE_PROVIDER_SITE_OTHER): Payer: Medicaid Other | Admitting: Neurology

## 2014-08-08 VITALS — BP 127/80 | HR 87 | Ht 67.0 in | Wt 193.6 lb

## 2014-08-08 DIAGNOSIS — R1310 Dysphagia, unspecified: Secondary | ICD-10-CM

## 2014-08-08 DIAGNOSIS — F449 Dissociative and conversion disorder, unspecified: Secondary | ICD-10-CM
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
## 2014-08-08 NOTE — Patient Instructions (Signed)
-   continue current medication - continue follow up with psychologist as scheduled once every two weeks - will recommend PCP to have a psychiatrist referral to rule out depression - no further neurological work up indicated at this time.  - recommend to increase home health hours per week to help her daily activity for at least short period time.  - follow up as needed.

## 2014-11-16 ENCOUNTER — Ambulatory Visit
Admit: 2014-11-16 | Disposition: A | Discharge status: Home / Self Care | Payer: Self-pay | Attending: Internal Medicine | Admitting: Internal Medicine

## 2014-11-29 ENCOUNTER — Other Ambulatory Visit: Payer: Self-pay | Admitting: Family Medicine

## 2014-11-29 DIAGNOSIS — N926 Irregular menstruation, unspecified: Secondary | ICD-10-CM

## 2014-11-30 ENCOUNTER — Other Ambulatory Visit: Payer: Medicaid Other

## 2014-11-30 ENCOUNTER — Ambulatory Visit
Admission: RE | Admit: 2014-11-30 | Discharge: 2014-11-30 | Disposition: A | Discharge status: Home / Self Care | Payer: Medicaid Other | Source: Ambulatory Visit | Attending: Family Medicine | Admitting: Family Medicine

## 2014-11-30 DIAGNOSIS — D259 Leiomyoma of uterus, unspecified: Secondary | ICD-10-CM | POA: Insufficient documentation

## 2014-11-30 DIAGNOSIS — M545 Low back pain: Secondary | ICD-10-CM | POA: Diagnosis present

## 2014-11-30 DIAGNOSIS — N926 Irregular menstruation, unspecified: Secondary | ICD-10-CM | POA: Diagnosis present

## 2015-07-28 NOTE — L&D Delivery Note (Addendum)
Obstetrical Delivery Note   Date of Delivery:   07/18/2016 Primary OB:   [facility] Gestational Age/EDD: [redacted]w[redacted]d  Antepartum complications: GHTN Intrapartum complications: none Delivered By:   TKB, CNM Delivery Type:   spontaneous vaginal delivery  Anesthesia:    epidural  GBS:    Negative Laceration:    2nd degree and vaginal Episiotomy:    none Placenta:    Delivered via active management. To pathology: no.  Estimated Blood Loss:  400 mL Baby's Name:   [place] Baby:    Liveborn female, [place] 8/9, weight 3230 gm   Delivery Details:   Quick second stage after prolonged active phase of labor. Infant to maternal abdomen for delayed cord clamping, cut by FOB after pulsation stopped. Infant to skin-to-skin with mother. See delivery note for full details  Addendum:  I inspected laceration and monitored repair.  No extensions into rectum or cervix. Barnett Applebaum, MD

## 2015-12-02 LAB — OB RESULTS CONSOLE RUBELLA ANTIBODY, IGM: RUBELLA: IMMUNE

## 2015-12-02 LAB — OB RESULTS CONSOLE GC/CHLAMYDIA
Chlamydia: NEGATIVE
Gonorrhea: NEGATIVE

## 2015-12-02 LAB — OB RESULTS CONSOLE VARICELLA ZOSTER ANTIBODY, IGG: Varicella: IMMUNE

## 2015-12-02 LAB — OB RESULTS CONSOLE HEPATITIS B SURFACE ANTIGEN: HEP B S AG: NEGATIVE

## 2015-12-02 LAB — OB RESULTS CONSOLE HIV ANTIBODY (ROUTINE TESTING): HIV: NONREACTIVE

## 2015-12-02 LAB — OB RESULTS CONSOLE RPR: RPR: NONREACTIVE

## 2016-06-04 ENCOUNTER — Observation Stay
Admission: EM | Admit: 2016-06-04 | Discharge: 2016-06-05 | Disposition: A | Discharge status: Home / Self Care | Payer: Medicaid Other | Attending: Obstetrics and Gynecology | Admitting: Obstetrics and Gynecology

## 2016-06-04 DIAGNOSIS — O99343 Other mental disorders complicating pregnancy, third trimester: Secondary | ICD-10-CM | POA: Insufficient documentation

## 2016-06-04 DIAGNOSIS — Z96642 Presence of left artificial hip joint: Secondary | ICD-10-CM | POA: Diagnosis not present

## 2016-06-04 DIAGNOSIS — O99283 Endocrine, nutritional and metabolic diseases complicating pregnancy, third trimester: Secondary | ICD-10-CM | POA: Insufficient documentation

## 2016-06-04 DIAGNOSIS — G809 Cerebral palsy, unspecified: Secondary | ICD-10-CM | POA: Diagnosis not present

## 2016-06-04 DIAGNOSIS — J45909 Unspecified asthma, uncomplicated: Secondary | ICD-10-CM | POA: Diagnosis not present

## 2016-06-04 DIAGNOSIS — O4700 False labor before 37 completed weeks of gestation, unspecified trimester: Principal | ICD-10-CM | POA: Insufficient documentation

## 2016-06-04 DIAGNOSIS — F4310 Post-traumatic stress disorder, unspecified: Secondary | ICD-10-CM | POA: Diagnosis not present

## 2016-06-04 DIAGNOSIS — Z3A32 32 weeks gestation of pregnancy: Secondary | ICD-10-CM | POA: Insufficient documentation

## 2016-06-04 DIAGNOSIS — E039 Hypothyroidism, unspecified: Secondary | ICD-10-CM | POA: Diagnosis not present

## 2016-06-04 DIAGNOSIS — Z6833 Body mass index (BMI) 33.0-33.9, adult: Secondary | ICD-10-CM | POA: Diagnosis not present

## 2016-06-04 DIAGNOSIS — O471 False labor at or after 37 completed weeks of gestation: Secondary | ICD-10-CM | POA: Diagnosis present

## 2016-06-04 DIAGNOSIS — O99213 Obesity complicating pregnancy, third trimester: Secondary | ICD-10-CM | POA: Diagnosis not present

## 2016-06-04 DIAGNOSIS — O99513 Diseases of the respiratory system complicating pregnancy, third trimester: Secondary | ICD-10-CM | POA: Diagnosis not present

## 2016-06-04 DIAGNOSIS — O99353 Diseases of the nervous system complicating pregnancy, third trimester: Secondary | ICD-10-CM | POA: Diagnosis not present

## 2016-06-04 LAB — URINALYSIS COMPLETE WITH MICROSCOPIC (ARMC ONLY)
BILIRUBIN URINE: NEGATIVE
Bacteria, UA: NONE SEEN
Glucose, UA: 150 mg/dL — AB
HGB URINE DIPSTICK: NEGATIVE
KETONES UR: NEGATIVE mg/dL
LEUKOCYTES UA: NEGATIVE
NITRITE: NEGATIVE
PH: 6 (ref 5.0–8.0)
PROTEIN: 30 mg/dL — AB
SPECIFIC GRAVITY, URINE: 1.036 — AB (ref 1.005–1.030)
WBC UA: NONE SEEN WBC/hpf (ref 0–5)

## 2016-06-05 DIAGNOSIS — O4700 False labor before 37 completed weeks of gestation, unspecified trimester: Secondary | ICD-10-CM | POA: Diagnosis not present

## 2016-06-05 LAB — WET PREP, GENITAL
Clue Cells Wet Prep HPF POC: NONE SEEN
SPERM: NONE SEEN
TRICH WET PREP: NONE SEEN
Yeast Wet Prep HPF POC: NONE SEEN

## 2016-06-05 LAB — CHLAMYDIA/NGC RT PCR (ARMC ONLY)
Chlamydia Tr: NOT DETECTED
N gonorrhoeae: NOT DETECTED
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
## 2016-06-05 NOTE — Discharge Instructions (Signed)
Braxton Hicks Contractions °Contractions of the uterus can occur throughout pregnancy. Contractions are not always a sign that you are in labor.  °WHAT ARE BRAXTON HICKS CONTRACTIONS?  °Contractions that occur before labor are called Braxton Hicks contractions, or false labor. Toward the end of pregnancy (32-34 weeks), these contractions can develop more often and may become more forceful. This is not true labor because these contractions do not result in opening (dilatation) and thinning of the cervix. They are sometimes difficult to tell apart from true labor because these contractions can be forceful and people have different pain tolerances. You should not feel embarrassed if you go to the hospital with false labor. Sometimes, the only way to tell if you are in true labor is for your health care provider to look for changes in the cervix. °If there are no prenatal problems or other health problems associated with the pregnancy, it is completely safe to be sent home with false labor and await the onset of true labor. °HOW CAN YOU TELL THE DIFFERENCE BETWEEN TRUE AND FALSE LABOR? °False Labor °· The contractions of false labor are usually shorter and not as hard as those of true labor.   °· The contractions are usually irregular.   °· The contractions are often felt in the front of the lower abdomen and in the groin.   °· The contractions may go away when you walk around or change positions while lying down.   °· The contractions get weaker and are shorter lasting as time goes on.   °· The contractions do not usually become progressively stronger, regular, and closer together as with true labor.   °True Labor °· Contractions in true labor last 30-70 seconds, become very regular, usually become more intense, and increase in frequency.   °· The contractions do not go away with walking.   °· The discomfort is usually felt in the top of the uterus and spreads to the lower abdomen and low back.   °· True labor can be  determined by your health care provider with an exam. This will show that the cervix is dilating and getting thinner.   °WHAT TO REMEMBER °· Keep up with your usual exercises and follow other instructions given by your health care provider.   °· Take medicines as directed by your health care provider.   °· Keep your regular prenatal appointments.   °· Eat and drink lightly if you think you are going into labor.   °· If Braxton Hicks contractions are making you uncomfortable:   °¨ Change your position from lying down or resting to walking, or from walking to resting.   °¨ Sit and rest in a tub of warm water.   °¨ Drink 2-3 glasses of water. Dehydration may cause these contractions.   °¨ Do slow and deep breathing several times an hour.   °WHEN SHOULD I SEEK IMMEDIATE MEDICAL CARE? °Seek immediate medical care if: °· Your contractions become stronger, more regular, and closer together.   °· You have fluid leaking or gushing from your vagina.   °· You have a fever.   °· You pass blood-tinged mucus.   °· You have vaginal bleeding.   °· You have continuous abdominal pain.   °· You have low back pain that you never had before.   °· You feel your baby's head pushing down and causing pelvic pressure.   °· Your baby is not moving as much as it used to.   °  °This information is not intended to replace advice given to you by your health care provider. Make sure you discuss any questions you have with your health care   provider. °  °Document Released: 07/13/2005 Document Revised: 07/18/2013 Document Reviewed: 04/24/2013 °Elsevier Interactive Patient Education ©2016 [employer] ° °

## 2016-06-24 IMAGING — MR MR CERVICAL SPINE W/O CM
4 of 6 series · 19 of 48 positions shown · non-contrast
Comparison: 10/14/2013

CLINICAL DATA: Generalized weakness. Sharp pains from the shoulders
to the hands and decreased sensation in the feet.

EXAM:
MRI CERVICAL SPINE WITHOUT CONTRAST
TECHNIQUE: Multiplanar, multisequence MR imaging of the cervical spine was
performed. No intravenous contrast was administered.

[Series 6: T2 · axial · 4.3mm · 0.35mm/px · z∈[-155,-49]mm · 8 of 26 slices shown (1 of 2)]
[im 1/26]
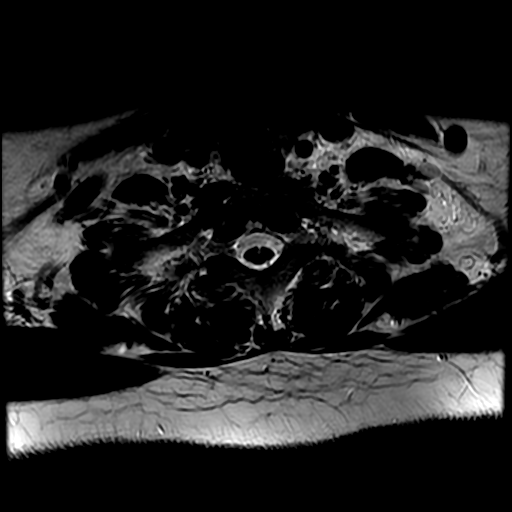
[im 3/26]
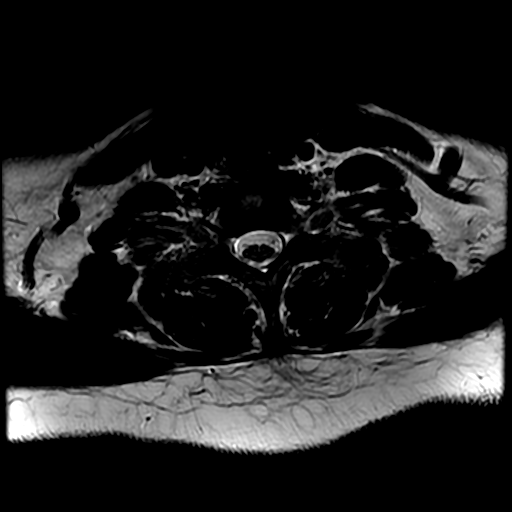
[im 9/26]
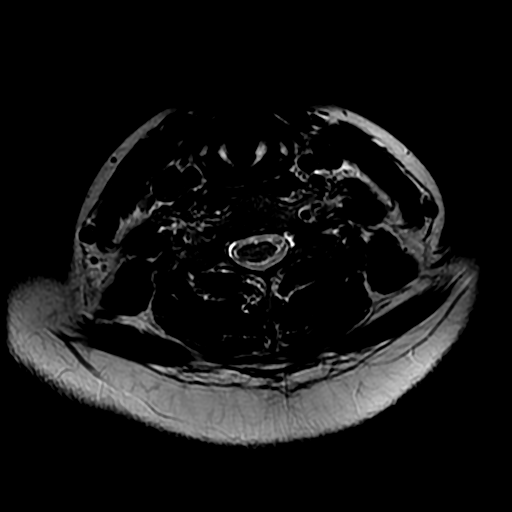
[im 12/26]
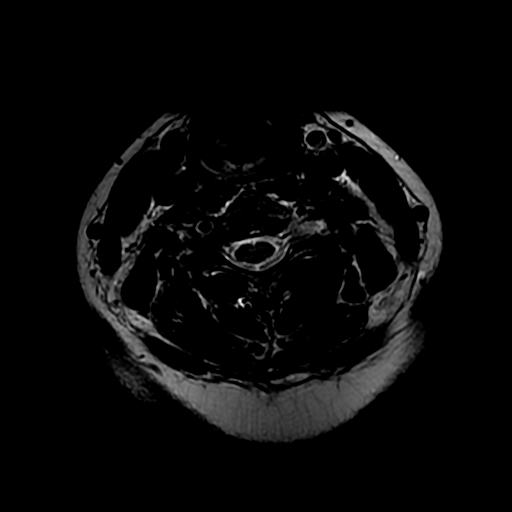
[im 14/26]
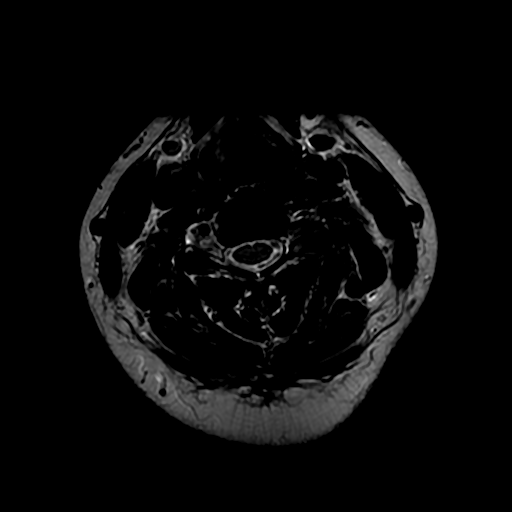
[im 17/26]
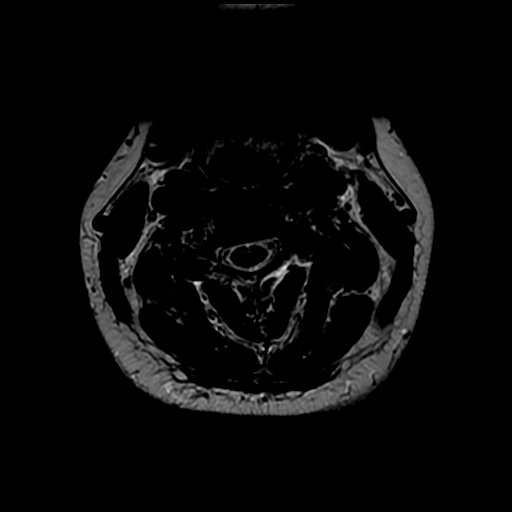
[im 23/26]
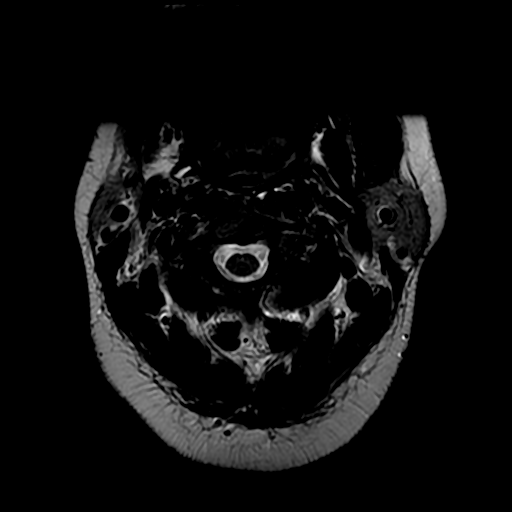
[im 26/26]
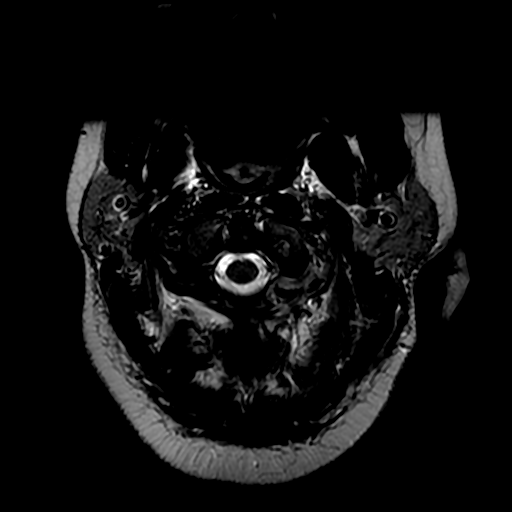

[Series 7: T1 · axial · 4.3mm · 0.35mm/px · z∈[-147,-62]mm · 3 of 26 slices shown]
[im 3/26]
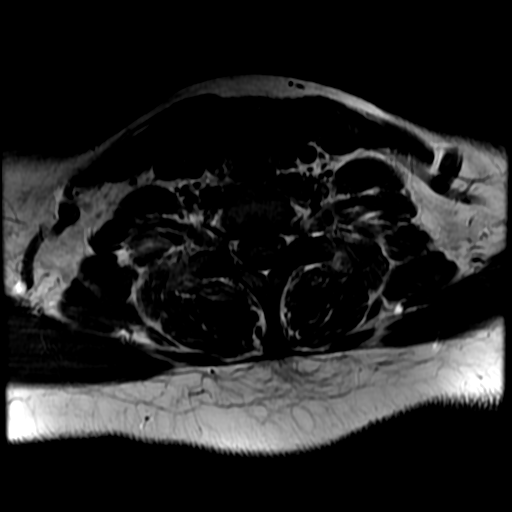
[im 14/26]
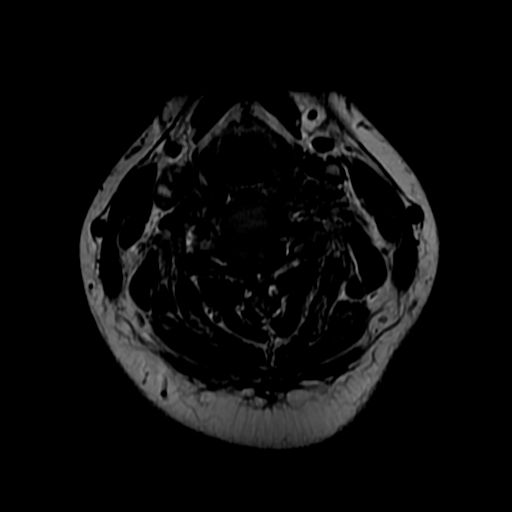
[im 23/26]
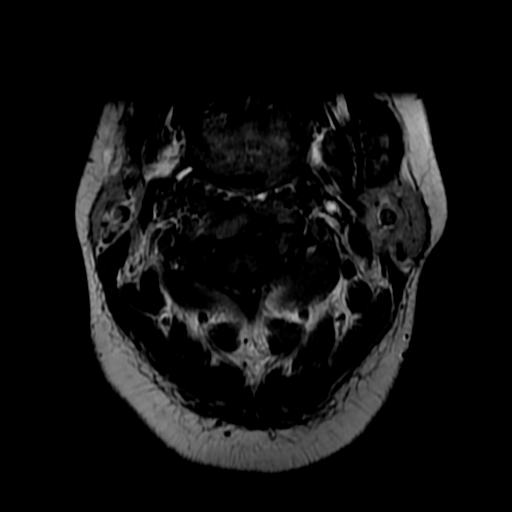

[Series 200: T2 · sagittal · 3.0mm · 0.39mm/px · 5 of 14 slices shown (2 of 2)]
[im 1/14]
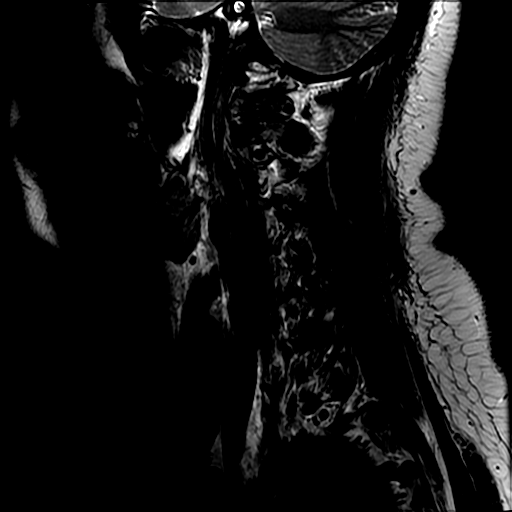
[im 3/14]
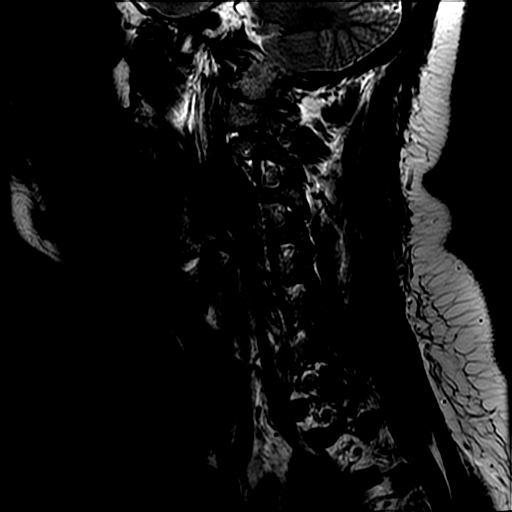
[im 6/14]
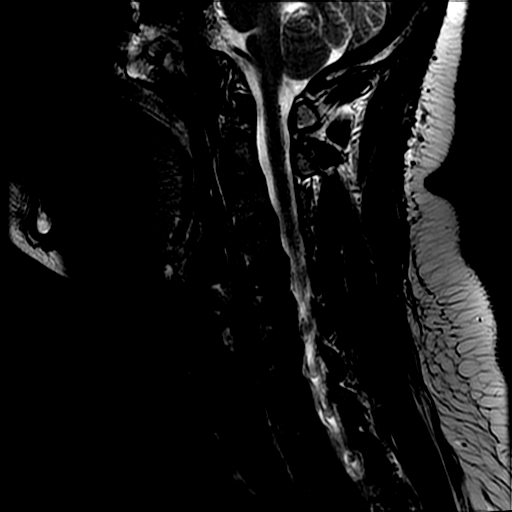
[im 8/14]
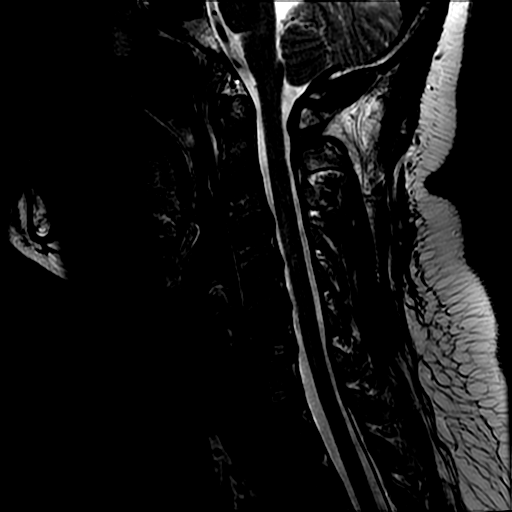
[im 14/14]
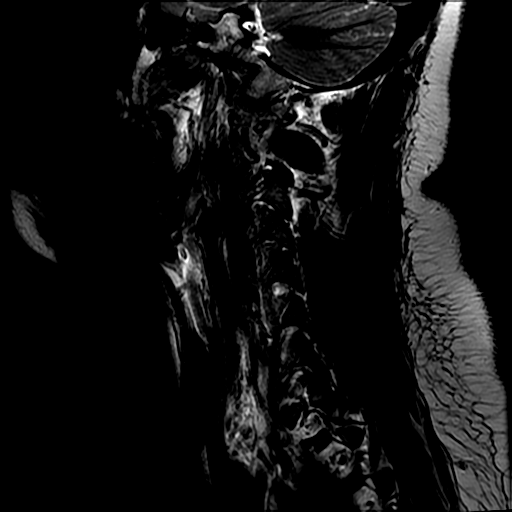

[Series 400: STIR · sagittal · 3.0mm · 0.39mm/px · 3 of 14 slices shown]
[im 3/14]
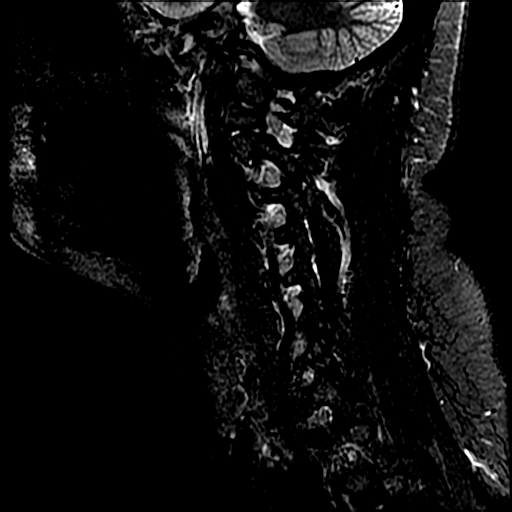
[im 8/14]
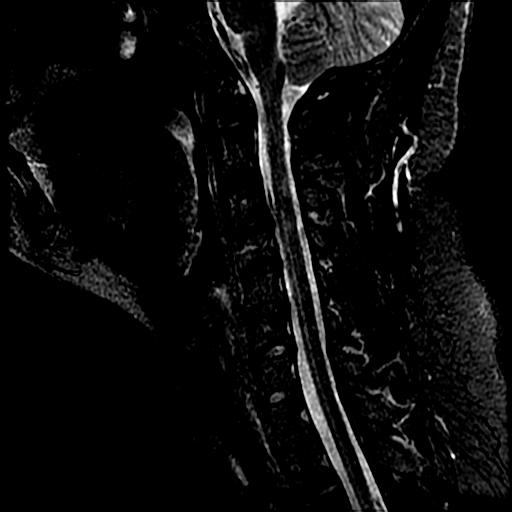
[im 14/14]
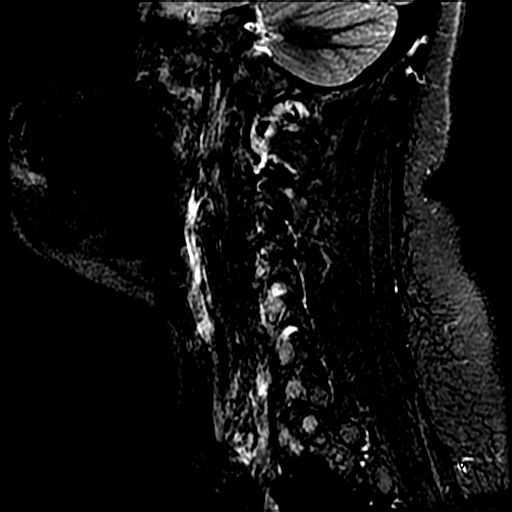

[19 of 48 positions shown; findings below may reference images not displayed]

FINDINGS: IV access could not be obtained and contrast was not administered.

Cervical spine straightening is unchanged. There is no significant
listhesis. Vertebral body heights are preserved. Vertebral bone
marrow signal is moderately diminished diffusely, not significantly
changed and may reflect underlying anemia although an infiltrative
marrow process is not excluded. There is multilevel cervical disc
desiccation. There is minimal disc space narrowing at C5-6. No
vertebral marrow edema is seen. Craniocervical junction is
unremarkable. Cervical spinal cord is normal in caliber and signal.
Paraspinal soft tissues are unremarkable.

C2-3:  Negative.

C3-4:  Trace disc bulging without stenosis.

C4-5:  Trace disc bulging without stenosis.

C5-6:  Trace disc bulging without stenosis.

C6-7:  Trace disc bulging without stenosis.

C7-T1:  Negative.
IMPRESSION: Minimal cervical spondylosis, unchanged and without stenosis.

## 2016-06-25 ENCOUNTER — Ambulatory Visit
Admission: RE | Admit: 2016-06-25 | Discharge: 2016-06-25 | Disposition: A | Discharge status: Home / Self Care | Payer: Medicaid Other | Source: Ambulatory Visit | Attending: Obstetrics and Gynecology | Admitting: Obstetrics and Gynecology

## 2016-06-25 VITALS — BP 111/71 | HR 102 | Temp 97.8°F | Resp 18 | Wt 217.8 lb

## 2016-06-25 DIAGNOSIS — J4520 Mild intermittent asthma, uncomplicated: Secondary | ICD-10-CM

## 2016-06-25 DIAGNOSIS — J309 Allergic rhinitis, unspecified: Secondary | ICD-10-CM

## 2016-06-25 DIAGNOSIS — R269 Unspecified abnormalities of gait and mobility: Secondary | ICD-10-CM

## 2016-06-25 DIAGNOSIS — T8484XS Pain due to internal orthopedic prosthetic devices, implants and grafts, sequela: Secondary | ICD-10-CM

## 2016-06-25 DIAGNOSIS — E039 Hypothyroidism, unspecified: Secondary | ICD-10-CM | POA: Diagnosis not present

## 2016-06-25 DIAGNOSIS — F4310 Post-traumatic stress disorder, unspecified: Secondary | ICD-10-CM

## 2016-06-25 DIAGNOSIS — R1310 Dysphagia, unspecified: Secondary | ICD-10-CM

## 2016-06-25 DIAGNOSIS — R1319 Other dysphagia: Secondary | ICD-10-CM

## 2016-06-25 DIAGNOSIS — Z96649 Presence of unspecified artificial hip joint: Secondary | ICD-10-CM

## 2016-06-25 DIAGNOSIS — M25552 Pain in left hip: Secondary | ICD-10-CM
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
## 2016-07-03 LAB — OB RESULTS CONSOLE GBS: STREP GROUP B AG: NEGATIVE

## 2016-07-13 ENCOUNTER — Observation Stay
Admission: EM | Admit: 2016-07-13 | Discharge: 2016-07-13 | Disposition: A | Discharge status: Home / Self Care | Payer: Medicaid Other | Attending: Obstetrics and Gynecology | Admitting: Obstetrics and Gynecology

## 2016-07-13 ENCOUNTER — Encounter: Payer: Self-pay | Admitting: *Deleted

## 2016-07-13 DIAGNOSIS — O471 False labor at or after 37 completed weeks of gestation: Principal | ICD-10-CM | POA: Insufficient documentation

## 2016-07-13 DIAGNOSIS — Z3A37 37 weeks gestation of pregnancy: Secondary | ICD-10-CM | POA: Diagnosis not present

## 2016-07-13 DIAGNOSIS — Z3490 Encounter for supervision of normal pregnancy, unspecified, unspecified trimester: Secondary | ICD-10-CM

## 2016-07-13 NOTE — OB Triage Note (Signed)
Pt. States contractions started one week ago, contractions getting more intense, pt. States she also loss mucus plug, "clear sticky substance". Positive for fetal movement, U/S applied - FHR 130s, Toco applied - abd soft. Last sexual intercourse was at conception.

## 2016-07-13 NOTE — Discharge Instructions (Signed)
Labor precautions and discharge instructions reviewed with patient, patient and spouse verbalized understanding.  Patient signed copy and copy given.

## 2016-07-13 NOTE — Final Progress Note (Signed)
L&D OB Triage Note  Pamela Palmer is a 30 y.o. G1P0 female at [redacted]w[redacted]d, EDD Estimated Date of Delivery: 07/28/16 who presented to triage for complaints of contractions q 5 minutes.  Has been contracting x 1 week, but have started getting closer together. Also notes loosing mucus plug. She was evaluated by the nurses with no significant findings for active labor. Vital signs stable. An NST was performed and has been reviewed by MD.    Physical Exam:  Blood pressure 132/82, pulse 77, temperature 98.3 F (36.8 C), temperature source Oral, resp. rate 18, height 5\' 7"  (1.702 m), weight 220 lb (99.8 kg). Cervix: 1/30/-2 (changed to 1 50/-2 with bloody show after 2 hrs).    NST INTERPRETATION: Indications: rule out uterine contractions  Mode: External (removed - pt. d/c home) Baseline Rate (A): 130 bpm Variability: Moderate Accelerations: 15 x 15 Decelerations: None     Contraction Frequency (min): 11-12  Impression: reactive   Plan: NST performed was reviewed and was found to be reactive. She was discharged home with bleeding/labor precautions.  Continue routine prenatal care. Follow up with OB/GYN as previously scheduled.     Rubie Maid, MD  [facility]

## 2016-07-17 ENCOUNTER — Inpatient Hospital Stay: Payer: Medicaid Other | Admitting: Anesthesiology

## 2016-07-17 ENCOUNTER — Inpatient Hospital Stay
Admission: EM | Admit: 2016-07-17 | Discharge: 2016-07-21 | DRG: 774 | Disposition: A | Discharge status: Home / Self Care | Payer: Medicaid Other | Attending: Obstetrics and Gynecology | Admitting: Obstetrics and Gynecology

## 2016-07-17 DIAGNOSIS — O9081 Anemia of the puerperium: Secondary | ICD-10-CM | POA: Diagnosis not present

## 2016-07-17 DIAGNOSIS — M6281 Muscle weakness (generalized): Secondary | ICD-10-CM

## 2016-07-17 DIAGNOSIS — R262 Difficulty in walking, not elsewhere classified: Secondary | ICD-10-CM

## 2016-07-17 DIAGNOSIS — O99284 Endocrine, nutritional and metabolic diseases complicating childbirth: Secondary | ICD-10-CM | POA: Diagnosis present

## 2016-07-17 DIAGNOSIS — Z3A38 38 weeks gestation of pregnancy: Secondary | ICD-10-CM

## 2016-07-17 DIAGNOSIS — O9952 Diseases of the respiratory system complicating childbirth: Secondary | ICD-10-CM | POA: Diagnosis present

## 2016-07-17 DIAGNOSIS — K219 Gastro-esophageal reflux disease without esophagitis: Secondary | ICD-10-CM | POA: Diagnosis present

## 2016-07-17 DIAGNOSIS — D62 Acute posthemorrhagic anemia: Secondary | ICD-10-CM | POA: Diagnosis not present

## 2016-07-17 DIAGNOSIS — Z96642 Presence of left artificial hip joint: Secondary | ICD-10-CM | POA: Diagnosis present

## 2016-07-17 DIAGNOSIS — J45909 Unspecified asthma, uncomplicated: Secondary | ICD-10-CM | POA: Diagnosis present

## 2016-07-17 DIAGNOSIS — E039 Hypothyroidism, unspecified: Secondary | ICD-10-CM | POA: Diagnosis present

## 2016-07-17 DIAGNOSIS — O134 Gestational [pregnancy-induced] hypertension without significant proteinuria, complicating childbirth: Secondary | ICD-10-CM | POA: Diagnosis present

## 2016-07-17 DIAGNOSIS — G809 Cerebral palsy, unspecified: Secondary | ICD-10-CM | POA: Diagnosis present

## 2016-07-17 DIAGNOSIS — Z8249 Family history of ischemic heart disease and other diseases of the circulatory system: Secondary | ICD-10-CM

## 2016-07-17 DIAGNOSIS — O9962 Diseases of the digestive system complicating childbirth: Secondary | ICD-10-CM | POA: Diagnosis present

## 2016-07-17 DIAGNOSIS — O1495 Unspecified pre-eclampsia, complicating the puerperium: Secondary | ICD-10-CM | POA: Diagnosis present

## 2016-07-17 DIAGNOSIS — Z3493 Encounter for supervision of normal pregnancy, unspecified, third trimester: Secondary | ICD-10-CM | POA: Diagnosis present

## 2016-07-17 LAB — COMPREHENSIVE METABOLIC PANEL
ALBUMIN: 3 g/dL — AB (ref 3.5–5.0)
ALT: 29 U/L (ref 14–54)
ANION GAP: 12 (ref 5–15)
AST: 26 U/L (ref 15–41)
Alkaline Phosphatase: 157 U/L — ABNORMAL HIGH (ref 38–126)
BILIRUBIN TOTAL: 0.5 mg/dL (ref 0.3–1.2)
BUN: 9 mg/dL (ref 6–20)
CALCIUM: 9.1 mg/dL (ref 8.9–10.3)
CO2: 17 mmol/L — ABNORMAL LOW (ref 22–32)
Chloride: 107 mmol/L (ref 101–111)
Creatinine, Ser: 0.7 mg/dL (ref 0.44–1.00)
GFR calc non Af Amer: >60 mL/min (ref >60)
GLUCOSE: 93 mg/dL (ref 65–99)
POTASSIUM: 3.1 mmol/L — AB (ref 3.5–5.1)
Sodium: 136 mmol/L (ref 135–145)
TOTAL PROTEIN: 6.8 g/dL (ref 6.5–8.1)

## 2016-07-17 LAB — CBC
HCT: 34 % — ABNORMAL LOW (ref 35.0–47.0)
HEMOGLOBIN: 11.5 g/dL — AB (ref 12.0–16.0)
MCH: 28.1 pg (ref 26.0–34.0)
MCHC: 33.8 g/dL (ref 32.0–36.0)
MCV: 83.3 fL (ref 80.0–100.0)
PLATELETS: 224 10*3/uL (ref 150–440)
RBC: 4.08 MIL/uL (ref 3.80–5.20)
RDW: 13.7 % (ref 11.5–14.5)
WBC: 9.7 10*3/uL (ref 3.6–11.0)

## 2016-07-17 LAB — TYPE AND SCREEN
ABO/RH(D): B POS
ANTIBODY SCREEN: NEGATIVE

## 2016-07-17 LAB — PROTEIN / CREATININE RATIO, URINE
CREATININE, URINE: 147 mg/dL
PROTEIN CREATININE RATIO: 0.24 mg/mg{creat} — AB (ref 0.00–0.15)
TOTAL PROTEIN, URINE: 36 mg/dL

## 2016-07-17 MED ORDER — SOD CITRATE-CITRIC ACID 500-334 MG/5ML PO SOLN: 30.0000 mL | ORAL | Status: DC | PRN

## 2016-07-17 MED ORDER — MISOPROSTOL 200 MCG PO TABS
ORAL_TABLET | ORAL | Status: AC
  Filled 2016-07-17: qty 4

## 2016-07-17 MED ORDER — FENTANYL 2.5 MCG/ML W/ROPIVACAINE 0.2% IN NS 100 ML EPIDURAL INFUSION (ARMC-ANES): EPIDURAL | Status: DC | PRN

## 2016-07-17 MED ORDER — OXYTOCIN 40 UNITS IN LACTATED RINGERS INFUSION - SIMPLE MED
2.5000 [IU]/h | INTRAVENOUS | Status: DC
  Filled 2016-07-17: qty 1000

## 2016-07-17 MED ORDER — OXYTOCIN 10 UNIT/ML IJ SOLN
INTRAMUSCULAR | Status: AC
  Filled 2016-07-17: qty 2

## 2016-07-17 MED ORDER — BUTORPHANOL TARTRATE 1 MG/ML IJ SOLN
1.0000 mg | INTRAMUSCULAR | Status: DC | PRN
  Administered 2016-07-17 (×2): 1 mg via INTRAVENOUS
  Filled 2016-07-17 (×2): qty 1

## 2016-07-17 MED ORDER — ONDANSETRON HCL 4 MG/2ML IJ SOLN
4.0000 mg | Freq: Four times a day (QID) | INTRAMUSCULAR | Status: DC | PRN
  Administered 2016-07-17 – 2016-07-18 (×2): 4 mg via INTRAVENOUS
  Filled 2016-07-17 (×2): qty 2

## 2016-07-17 MED ORDER — LACTATED RINGERS IV SOLN
INTRAVENOUS | Status: DC
  Administered 2016-07-17: 14:00:00 via INTRAVENOUS
  Administered 2016-07-18 (×2): 125 mL/h via INTRAVENOUS

## 2016-07-17 MED ORDER — FENTANYL 2.5 MCG/ML W/ROPIVACAINE 0.2% IN NS 100 ML EPIDURAL INFUSION (ARMC-ANES)
EPIDURAL | Status: DC | PRN
  Administered 2016-07-17: 9 mL/h via EPIDURAL
  Administered 2016-07-18: 06:00:00

## 2016-07-17 MED ORDER — BUPIVACAINE HCL (PF) 0.25 % IJ SOLN
INTRAMUSCULAR | Status: DC | PRN
  Administered 2016-07-17: 5 mL via EPIDURAL

## 2016-07-17 MED ORDER — OXYTOCIN BOLUS FROM INFUSION
500.0000 mL | Freq: Once | INTRAVENOUS | Status: AC
  Administered 2016-07-18: 500 mL via INTRAVENOUS

## 2016-07-17 MED ORDER — FENTANYL 2.5 MCG/ML W/ROPIVACAINE 0.2% IN NS 100 ML EPIDURAL INFUSION (ARMC-ANES)
EPIDURAL | Status: AC
  Filled 2016-07-17: qty 100

## 2016-07-17 MED ORDER — ACETAMINOPHEN 325 MG PO TABS
650.0000 mg | ORAL_TABLET | ORAL | Status: DC | PRN
  Administered 2016-07-18: 650 mg via ORAL
  Filled 2016-07-17: qty 2

## 2016-07-17 MED ORDER — OXYTOCIN 40 UNITS IN LACTATED RINGERS INFUSION - SIMPLE MED
1.0000 m[IU]/min | INTRAVENOUS | Status: DC
  Administered 2016-07-17 – 2016-07-18 (×2): 2 m[IU]/min via INTRAVENOUS

## 2016-07-17 MED ORDER — LIDOCAINE HCL (PF) 1 % IJ SOLN
30.0000 mL | INTRAMUSCULAR | Status: AC | PRN
  Administered 2016-07-17: 3 mL via SUBCUTANEOUS

## 2016-07-17 MED ORDER — LACTATED RINGERS IV SOLN
500.0000 mL | INTRAVENOUS | Status: DC | PRN
  Administered 2016-07-17: 1000 mL via INTRAVENOUS

## 2016-07-17 MED ORDER — LIDOCAINE HCL (PF) 1 % IJ SOLN
INTRAMUSCULAR | Status: AC
  Administered 2016-07-18: 30 mL
  Filled 2016-07-17: qty 30

## 2016-07-17 MED ORDER — LIDOCAINE-EPINEPHRINE (PF) 1.5 %-1:200000 IJ SOLN
INTRAMUSCULAR | Status: DC | PRN
  Administered 2016-07-17: 4 mL via EPIDURAL

## 2016-07-17 MED ORDER — AMMONIA AROMATIC IN INHA
RESPIRATORY_TRACT | Status: AC
  Filled 2016-07-17: qty 10
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
## 2016-07-17 NOTE — Progress Notes (Signed)
Subjective:  Doing well, comfrotable with epidural in place  Objective:   Vitals: Blood pressure 131/76, pulse 81, temperature 97.3 F (36.3 C), temperature source Oral, resp. rate 18, height 5\' 7"  (1.702 m), weight 220 lb (99.8 kg), SpO2 99 %. General: NAD Abdomen: gravid, non-tender Cervical Exam:  Dilation: 4.5 Effacement (%): 80, 90 Cervical Position: Middle Station: -2 Presentation: Vertex Exam by:: JCM Forebag ruptured clear fluid  FHT: 130, moderate, +accels, no decels Toco: q18min  Results for orders placed or performed during the hospital encounter of 07/17/16 (from the past 24 hour(s))  CBC     Status: Abnormal   Collection Time: 07/17/16  2:20 PM  Result Value Ref Range   WBC 9.7 3.6 - 11.0 K/uL   RBC 4.08 3.80 - 5.20 MIL/uL   Hemoglobin 11.5 (L) 12.0 - 16.0 g/dL   HCT 34.0 (L) 35.0 - 47.0 %   MCV 83.3 80.0 - 100.0 fL   MCH 28.1 26.0 - 34.0 pg   MCHC 33.8 32.0 - 36.0 g/dL   RDW 13.7 11.5 - 14.5 %   Platelets 224 150 - 440 K/uL  Type and screen [place]     Status: None   Collection Time: 07/17/16  2:20 PM  Result Value Ref Range   ABO/RH(D) B POS    Antibody Screen NEG    Sample Expiration 07/20/2016   Comprehensive metabolic panel     Status: Abnormal   Collection Time: 07/17/16  2:20 PM  Result Value Ref Range   Sodium 136 135 - 145 mmol/L   Potassium 3.1 (L) 3.5 - 5.1 mmol/L   Chloride 107 101 - 111 mmol/L   CO2 17 (L) 22 - 32 mmol/L   Glucose, Bld 93 65 - 99 mg/dL   BUN 9 6 - 20 mg/dL   Creatinine, Ser 0.70 0.44 - 1.00 mg/dL   Calcium 9.1 8.9 - 10.3 mg/dL   Total Protein 6.8 6.5 - 8.1 g/dL   Albumin 3.0 (L) 3.5 - 5.0 g/dL   AST 26 15 - 41 U/L   ALT 29 14 - 54 U/L   Alkaline Phosphatase 157 (H) 38 - 126 U/L   Total Bilirubin 0.5 0.3 - 1.2 mg/dL   GFR calc non Af Amer >60 >60 mL/min   GFR calc Af Amer >60 >60 mL/min   Anion gap 12 5 - 15  Protein / creatinine ratio, urine     Status: Abnormal   Collection Time: 07/17/16   2:20 PM  Result Value Ref Range   Creatinine, Urine 147 mg/dL   Total Protein, Urine 36 mg/dL   Protein Creatinine Ratio 0.24 (H) 0.00 - 0.15 mg/mg[Cre]    Assessment:   30 y.o. G1P0 [redacted]w[redacted]d augmentation of labor for GHTN  Plan:   1) Labor -titrate up on pitocin  2) Fetus - category I tracing

## 2016-07-17 NOTE — Anesthesia Preprocedure Evaluation (Signed)
Anesthesia Evaluation  Patient identified by MRN, date of birth, ID band Patient awake    Reviewed: Allergy & Precautions, H&P , NPO status , Patient's Chart, lab work & pertinent test results, reviewed documented beta blocker date and time   Airway Mallampati: II  TM Distance: >3 FB Neck ROM: full    Dental no notable dental hx. (+) Teeth Intact   Pulmonary neg pulmonary ROS, asthma , sleep apnea , Current Smoker,    Pulmonary exam normal breath sounds clear to auscultation       Cardiovascular Exercise Tolerance: Good negative cardio ROS   Rhythm:regular Rate:Normal     Neuro/Psych  Headaches, PSYCHIATRIC DISORDERS High functioning CP negative neurological ROS  negative psych ROS   GI/Hepatic negative GI ROS, Neg liver ROS, GERD  ,  Endo/Other  negative endocrine ROSdiabetesHypothyroidism   Renal/GU      Musculoskeletal  (+) Arthritis , Osteoarthritis,    Abdominal   Peds  Hematology negative hematology ROS (+)   Anesthesia Other Findings   Reproductive/Obstetrics (+) Pregnancy                             Anesthesia Physical Anesthesia Plan  ASA: III  Anesthesia Plan: Epidural   Post-op Pain Management:    Induction:   Airway Management Planned:   Additional Equipment:   Intra-op Plan:   Post-operative Plan:   Informed Consent: I have reviewed the patients History and Physical, chart, labs and discussed the procedure including the risks, benefits and alternatives for the proposed anesthesia with the patient or authorized representative who has indicated his/her understanding and acceptance.     Plan Discussed with:   Anesthesia Plan Comments:         Anesthesia Quick Evaluation

## 2016-07-17 NOTE — Anesthesia Procedure Notes (Signed)
Epidural Patient location during procedure: OB  Staffing Performed: anesthesiologist   Preanesthetic Checklist Completed: patient identified, site marked, surgical consent, pre-op evaluation, timeout performed, IV checked, risks and benefits discussed and monitors and equipment checked  Epidural Patient position: sitting Prep: Betadine Patient monitoring: heart rate, continuous pulse ox and blood pressure Approach: midline Location: L4-L5 Injection technique: LOR saline  Needle:  Needle type: Tuohy  Needle gauge: 17 G Needle length: 9 cm and 9 Needle insertion depth: 7 cm Catheter type: closed end flexible Catheter size: 19 Gauge Catheter at skin depth: 13 cm Test dose: negative and 1.5% lidocaine with Epi 1:200 K  Assessment Sensory level: T10 Events: blood not aspirated, injection not painful, no injection resistance, negative IV test and no paresthesia  Additional Notes   Patient tolerated the insertion well without complications.-SATD -IVTD. No paresthesia. Refer to [facility] nursing for VS and dosingReason for block:procedure for pain     

## 2016-07-18 ENCOUNTER — Encounter: Payer: Self-pay | Admitting: Advanced Practice Midwife

## 2016-07-18 LAB — RPR: RPR: NONREACTIVE

## 2016-07-18 MED ORDER — LEVOTHYROXINE SODIUM 50 MCG PO TABS
50.0000 ug | ORAL_TABLET | Freq: Every day | ORAL | Status: DC
  Administered 2016-07-19 – 2016-07-21 (×3): 50 ug via ORAL
  Filled 2016-07-18 (×3): qty 1

## 2016-07-18 MED ORDER — PHENYLEPHRINE 40 MCG/ML (10ML) SYRINGE FOR IV PUSH (FOR BLOOD PRESSURE SUPPORT): 80.0000 ug | PREFILLED_SYRINGE | INTRAVENOUS | Status: DC | PRN

## 2016-07-18 MED ORDER — WITCH HAZEL-GLYCERIN EX PADS: 1.0000 "application " | MEDICATED_PAD | CUTANEOUS | Status: DC | PRN

## 2016-07-18 MED ORDER — ACETAMINOPHEN 325 MG PO TABS
650.0000 mg | ORAL_TABLET | ORAL | Status: DC | PRN
  Administered 2016-07-20: 650 mg via ORAL
  Filled 2016-07-18: qty 2

## 2016-07-18 MED ORDER — FENTANYL 2.5 MCG/ML W/ROPIVACAINE 0.2% IN NS 100 ML EPIDURAL INFUSION (ARMC-ANES)
9.0000 mL/h | EPIDURAL | Status: DC
  Administered 2016-07-18: 9 mL/h via EPIDURAL
  Filled 2016-07-18: qty 100

## 2016-07-18 MED ORDER — ONDANSETRON HCL 4 MG PO TABS: 4.0000 mg | ORAL_TABLET | ORAL | Status: DC | PRN

## 2016-07-18 MED ORDER — LACTATED RINGERS IV SOLN
INTRAVENOUS | Status: DC
  Administered 2016-07-19 – 2016-07-20 (×3): via INTRAVENOUS

## 2016-07-18 MED ORDER — SIMETHICONE 80 MG PO CHEW: 80.0000 mg | CHEWABLE_TABLET | ORAL | Status: DC | PRN

## 2016-07-18 MED ORDER — DIPHENHYDRAMINE HCL 25 MG PO CAPS: 25.0000 mg | ORAL_CAPSULE | Freq: Four times a day (QID) | ORAL | Status: DC | PRN

## 2016-07-18 MED ORDER — OXYCODONE-ACETAMINOPHEN 5-325 MG PO TABS
1.0000 | ORAL_TABLET | ORAL | Status: DC | PRN
  Administered 2016-07-19 – 2016-07-20 (×3): 1 via ORAL
  Filled 2016-07-18 (×3): qty 1

## 2016-07-18 MED ORDER — COCONUT OIL OIL
1.0000 "application " | TOPICAL_OIL | Status: DC | PRN
  Filled 2016-07-18: qty 120

## 2016-07-18 MED ORDER — ALBUTEROL SULFATE HFA 108 (90 BASE) MCG/ACT IN AERS: 2.0000 | INHALATION_SPRAY | Freq: Four times a day (QID) | RESPIRATORY_TRACT | Status: DC | PRN

## 2016-07-18 MED ORDER — DIBUCAINE 1 % RE OINT: 1.0000 "application " | TOPICAL_OINTMENT | RECTAL | Status: DC | PRN

## 2016-07-18 MED ORDER — IBUPROFEN 600 MG PO TABS
600.0000 mg | ORAL_TABLET | Freq: Four times a day (QID) | ORAL | Status: DC
  Administered 2016-07-18 – 2016-07-21 (×13): 600 mg via ORAL
  Filled 2016-07-18 (×12): qty 1

## 2016-07-18 MED ORDER — PRENATAL MULTIVITAMIN CH
1.0000 | ORAL_TABLET | Freq: Every day | ORAL | Status: DC
  Administered 2016-07-18 – 2016-07-21 (×4): 1 via ORAL
  Filled 2016-07-18 (×6): qty 1

## 2016-07-18 MED ORDER — EPHEDRINE 5 MG/ML INJ: 10.0000 mg | INTRAVENOUS | Status: DC | PRN

## 2016-07-18 MED ORDER — LACTATED RINGERS IV SOLN: 500.0000 mL | Freq: Once | INTRAVENOUS | Status: DC

## 2016-07-18 MED ORDER — OXYTOCIN 40 UNITS IN LACTATED RINGERS INFUSION - SIMPLE MED: 1.0000 m[IU]/min | INTRAVENOUS | Status: DC

## 2016-07-18 MED ORDER — ZOLPIDEM TARTRATE 5 MG PO TABS: 5.0000 mg | ORAL_TABLET | Freq: Every evening | ORAL | Status: DC | PRN

## 2016-07-18 MED ORDER — DIPHENHYDRAMINE HCL 50 MG/ML IJ SOLN: 12.5000 mg | INTRAMUSCULAR | Status: DC | PRN

## 2016-07-18 MED ORDER — FERROUS SULFATE 325 (65 FE) MG PO TABS
325.0000 mg | ORAL_TABLET | Freq: Every day | ORAL | Status: DC
  Administered 2016-07-19 – 2016-07-21 (×3): 325 mg via ORAL
  Filled 2016-07-18 (×3): qty 1

## 2016-07-18 MED ORDER — ALBUTEROL SULFATE (2.5 MG/3ML) 0.083% IN NEBU: 2.5000 mg | INHALATION_SOLUTION | Freq: Four times a day (QID) | RESPIRATORY_TRACT | Status: DC | PRN

## 2016-07-18 MED ORDER — DOCUSATE SODIUM 100 MG PO CAPS: 100.0000 mg | ORAL_CAPSULE | Freq: Two times a day (BID) | ORAL | Status: DC | PRN

## 2016-07-18 MED ORDER — TERBUTALINE SULFATE 1 MG/ML IJ SOLN: 0.2500 mg | Freq: Once | INTRAMUSCULAR | Status: DC | PRN

## 2016-07-18 MED ORDER — BENZOCAINE-MENTHOL 20-0.5 % EX AERO
1.0000 "application " | INHALATION_SPRAY | CUTANEOUS | Status: DC | PRN
  Administered 2016-07-18 – 2016-07-19 (×2): 1 via TOPICAL
  Filled 2016-07-18 (×2): qty 56

## 2016-07-18 MED ORDER — KETOROLAC TROMETHAMINE 30 MG/ML IJ SOLN
INTRAMUSCULAR | Status: AC
  Administered 2016-07-18: 30 mg via INTRAVENOUS
  Filled 2016-07-18: qty 1

## 2016-07-18 MED ORDER — OXYCODONE-ACETAMINOPHEN 5-325 MG PO TABS: 2.0000 | ORAL_TABLET | ORAL | Status: DC | PRN

## 2016-07-18 MED ORDER — ONDANSETRON HCL 4 MG/2ML IJ SOLN: 4.0000 mg | INTRAMUSCULAR | Status: DC | PRN

## 2016-07-18 NOTE — Progress Notes (Signed)
PT Cancellation Note  Patient Details Name: Pamela Palmer MRN: [id number] DOB: 1985-08-15   Cancelled Treatment:     Attempted to see pt ~1300 and her baby was sleeping soundly on her chest, reports she was very dizzy trying to get up to go from labor room to current room, feels weak from 20 hrs of labor and would like to try later.  Called nursing this afternoon and she reports that pt currently having skin-to-skin time with baby and that lactation will be coming soon.  We decided to hold until tomorrow.   Kreg Shropshire, DPT 07/18/2016, 4:26 PM

## 2016-07-18 NOTE — Progress Notes (Signed)
Dr Georgianne Fick given report re onset of h/a for pt. No c/o's of visual disturbance,upper abd pain. Pt has reflexes 2+, 0 clonus. Out put remains concentrated looking, but amount adequate. Pt reveals that she did not drink anything yesterday. Pt and FOB have stated that pt has similar L Hip pain occurring while at home, due to too much resting in bed. At home pt states she usually gets up to walk for reliert

## 2016-07-18 NOTE — Discharge Summary (Signed)
Obstetric Discharge Summary Reason for Admission: GHTN Delivery Type: spontaneous vaginal delivery Postpartum Procedures: Magnesium Sulfate for early stage preeclampsia Complications-Intrapartum or Postpartum: 2nd degree perineal laceration  Recent Labs  07/19/16 1909 07/20/16 0739  HGB 7.2* 7.5*  HCT 21.7* 22.0*   Gestational Age at Delivery: [redacted]w[redacted]d  Antepartum complications: GHTN Date of Delivery: 07/18/16   Delivered By: Darrin Luis  Physical Exam:  General: alert and cooperative Lochia: appropriate Uterine Fundus: firm Incision:N/A DVT Evaluation: No evidence of DVT seen on physical exam. Abdomen: abdomen is soft without significant tenderness, masses, organomegaly or guarding  Prenatal Labs Blood Type: B+ Rubella: Immune Varicella: Immune TDAP: Up to date Flu: Given during pregnancy Feeding: Breast Contraception: Condoms for now  Discharge Diagnoses: Term Pregnancy-delivered  Discharge Information: Date: 07/21/2016 Activity: unrestricted Diet: routine Medications: PNV, Ibuprofen and Iron Condition: stable Instructions:  Discharge instructions:   Call office if you have any of the following: headache, visual changes, fever >100 F, chills, breast concerns, excessive vaginal bleeding, incision drainage or problems, leg pain or redness, depression or any other concerns.   Activity: Do not lift > 10 lbs for 6 weeks.  No intercourse or tampons for 6 weeks.  No driving for 1-2 weeks.   Discharge to: [place] Follow up in 6 week(s).   Why:  postpartum check Contact information: [place] [address] [phone]           Newborn Data: Live born female  Birth Weight: 7 lb 1.9 oz (3230 g) APGAR: 8, 9  Home with mother.  Hoyt Koch, MD 07/21/2016, 8:47 AM

## 2016-07-18 NOTE — Progress Notes (Addendum)
Subjective:  Reports headache that started overnight.  No vision changes, RUQ or epigastric pain.  Otherwise comfort bale with epidural   Objective:   Vitals: Blood pressure (!) 148/89, pulse 86, temperature 98.8 F (37.1 C), temperature source Oral, resp. rate 20, height 5\' 7"  (1.702 m), weight 220 lb (99.8 kg), SpO2 100 %. General:  Abdomen: Cervical Exam:  Dilation: 9 Effacement (%): 100 Cervical Position: Middle Station: +2 Presentation: Vertex Exam by:: Geni Bers RN  FHT: 125. Moderate, +Accels, no decels Toco: q1-71min occasional couplets  Results for orders placed or performed during the hospital encounter of 07/17/16 (from the past 24 hour(s))  CBC     Status: Abnormal   Collection Time: 07/17/16  2:20 PM  Result Value Ref Range   WBC 9.7 3.6 - 11.0 K/uL   RBC 4.08 3.80 - 5.20 MIL/uL   Hemoglobin 11.5 (L) 12.0 - 16.0 g/dL   HCT 34.0 (L) 35.0 - 47.0 %   MCV 83.3 80.0 - 100.0 fL   MCH 28.1 26.0 - 34.0 pg   MCHC 33.8 32.0 - 36.0 g/dL   RDW 13.7 11.5 - 14.5 %   Platelets 224 150 - 440 K/uL  Type and screen [place]     Status: None   Collection Time: 07/17/16  2:20 PM  Result Value Ref Range   ABO/RH(D) B POS    Antibody Screen NEG    Sample Expiration 07/20/2016   RPR     Status: None   Collection Time: 07/17/16  2:20 PM  Result Value Ref Range   RPR Ser Ql Non Reactive Non Reactive  Comprehensive metabolic panel     Status: Abnormal   Collection Time: 07/17/16  2:20 PM  Result Value Ref Range   Sodium 136 135 - 145 mmol/L   Potassium 3.1 (L) 3.5 - 5.1 mmol/L   Chloride 107 101 - 111 mmol/L   CO2 17 (L) 22 - 32 mmol/L   Glucose, Bld 93 65 - 99 mg/dL   BUN 9 6 - 20 mg/dL   Creatinine, Ser 0.70 0.44 - 1.00 mg/dL   Calcium 9.1 8.9 - 10.3 mg/dL   Total Protein 6.8 6.5 - 8.1 g/dL   Albumin 3.0 (L) 3.5 - 5.0 g/dL   AST 26 15 - 41 U/L   ALT 29 14 - 54 U/L   Alkaline Phosphatase 157 (H) 38 - 126 U/L   Total Bilirubin 0.5 0.3 - 1.2 mg/dL   GFR calc non Af Amer >60 >60 mL/min   GFR calc Af Amer >60 >60 mL/min   Anion gap 12 5 - 15  Protein / creatinine ratio, urine     Status: Abnormal   Collection Time: 07/17/16  2:20 PM  Result Value Ref Range   Creatinine, Urine 147 mg/dL   Total Protein, Urine 36 mg/dL   Protein Creatinine Ratio 0.24 (H) 0.00 - 0.15 mg/mg[Cre]    Assessment:   30 y.o. G1P0 [redacted]w[redacted]d   Plan:   1) Labor - continue to titrate up pitocin - prolonged active phase, has been changing though and labor pattern inconsistent at time - EFW 07/06/2016 3377g or 7lbs 7oz consistent with 75.5%  2) Fetus - category I tracing  3)GHTN/ Headache - trial of tylenol, labs obtain yesterday at admission were negative for protein, and otherwise normal.  If persists despite tylenol consider magnesiusm sulfate

## 2016-07-18 NOTE — Progress Notes (Signed)
Subjective:  Doing well no concerns  Objective:   Vitals: Blood pressure (!) 143/80, pulse 86, temperature 98.5 F (36.9 C), temperature source Oral, resp. rate 16, height 5\' 7"  (1.702 m), weight 220 lb (99.8 kg), SpO2 98 %. General: resting, NAD Abdomen: gravid. Non-tender Cervical Exam:  Dilation: 6 Effacement (%): 80 Cervical Position: Middle Station: -1 Presentation: Vertex Exam by:: Geni Bers RN  FHT: 130, moderate, +accels, no decels Toco: q41min  Results for orders placed or performed during the hospital encounter of 07/17/16 (from the past 24 hour(s))  CBC     Status: Abnormal   Collection Time: 07/17/16  2:20 PM  Result Value Ref Range   WBC 9.7 3.6 - 11.0 K/uL   RBC 4.08 3.80 - 5.20 MIL/uL   Hemoglobin 11.5 (L) 12.0 - 16.0 g/dL   HCT 34.0 (L) 35.0 - 47.0 %   MCV 83.3 80.0 - 100.0 fL   MCH 28.1 26.0 - 34.0 pg   MCHC 33.8 32.0 - 36.0 g/dL   RDW 13.7 11.5 - 14.5 %   Platelets 224 150 - 440 K/uL  Type and screen [place]     Status: None   Collection Time: 07/17/16  2:20 PM  Result Value Ref Range   ABO/RH(D) B POS    Antibody Screen NEG    Sample Expiration 07/20/2016   Comprehensive metabolic panel     Status: Abnormal   Collection Time: 07/17/16  2:20 PM  Result Value Ref Range   Sodium 136 135 - 145 mmol/L   Potassium 3.1 (L) 3.5 - 5.1 mmol/L   Chloride 107 101 - 111 mmol/L   CO2 17 (L) 22 - 32 mmol/L   Glucose, Bld 93 65 - 99 mg/dL   BUN 9 6 - 20 mg/dL   Creatinine, Ser 0.70 0.44 - 1.00 mg/dL   Calcium 9.1 8.9 - 10.3 mg/dL   Total Protein 6.8 6.5 - 8.1 g/dL   Albumin 3.0 (L) 3.5 - 5.0 g/dL   AST 26 15 - 41 U/L   ALT 29 14 - 54 U/L   Alkaline Phosphatase 157 (H) 38 - 126 U/L   Total Bilirubin 0.5 0.3 - 1.2 mg/dL   GFR calc non Af Amer >60 >60 mL/min   GFR calc Af Amer >60 >60 mL/min   Anion gap 12 5 - 15  Protein / creatinine ratio, urine     Status: Abnormal   Collection Time: 07/17/16  2:20 PM  Result Value Ref Range   Creatinine, Urine 147 mg/dL   Total Protein, Urine 36 mg/dL   Protein Creatinine Ratio 0.24 (H) 0.00 - 0.15 mg/mg[Cre]    Assessment:   30 y.o. G1P0 [redacted]w[redacted]d augmentation of term labor for GHTN  Plan:   1) Labor - continue pitocin, IUPC and FSE placed to aid in titration  2) Fetus - cat I tracing

## 2016-07-19 LAB — PROTEIN / CREATININE RATIO, URINE
Creatinine, Urine: 114 mg/dL
Protein Creatinine Ratio: 0.09 mg/mg{Cre} (ref 0.00–0.15)
Total Protein, Urine: 10 mg/dL

## 2016-07-19 LAB — CBC
HCT: 21.7 % — ABNORMAL LOW (ref 35.0–47.0)
HEMATOCRIT: 21.7 % — AB (ref 35.0–47.0)
HEMOGLOBIN: 7.2 g/dL — AB (ref 12.0–16.0)
Hemoglobin: 7.3 g/dL — ABNORMAL LOW (ref 12.0–16.0)
MCH: 28.6 pg (ref 26.0–34.0)
MCH: 28.1 pg (ref 26.0–34.0)
MCHC: 33.7 g/dL (ref 32.0–36.0)
MCHC: 33.3 g/dL (ref 32.0–36.0)
MCV: 84.8 fL (ref 80.0–100.0)
MCV: 84.5 fL (ref 80.0–100.0)
Platelets: 178 10*3/uL (ref 150–440)
Platelets: 177 10*3/uL (ref 150–440)
RBC: 2.56 MIL/uL — ABNORMAL LOW (ref 3.80–5.20)
RBC: 2.57 MIL/uL — AB (ref 3.80–5.20)
RDW: 13.8 % (ref 11.5–14.5)
RDW: 13.8 % (ref 11.5–14.5)
WBC: 10.1 10*3/uL (ref 3.6–11.0)
WBC: 10 10*3/uL (ref 3.6–11.0)

## 2016-07-19 LAB — COMPREHENSIVE METABOLIC PANEL
ALT: 26 U/L (ref 14–54)
AST: 32 U/L (ref 15–41)
Albumin: 2.3 g/dL — ABNORMAL LOW (ref 3.5–5.0)
Alkaline Phosphatase: 96 U/L (ref 38–126)
Anion gap: 7 (ref 5–15)
BUN: 11 mg/dL (ref 6–20)
CO2: 22 mmol/L (ref 22–32)
Calcium: 8.6 mg/dL — ABNORMAL LOW (ref 8.9–10.3)
Chloride: 110 mmol/L (ref 101–111)
Creatinine, Ser: 0.93 mg/dL (ref 0.44–1.00)
GFR calc Af Amer: >60 mL/min (ref >60)
GFR calc non Af Amer: >60 mL/min (ref >60)
Glucose, Bld: 121 mg/dL — ABNORMAL HIGH (ref 65–99)
Potassium: 3.2 mmol/L — ABNORMAL LOW (ref 3.5–5.1)
Sodium: 139 mmol/L (ref 135–145)
Total Bilirubin: <0.1 mg/dL — ABNORMAL LOW (ref 0.3–1.2)
Total Protein: 5 g/dL — ABNORMAL LOW (ref 6.5–8.1)

## 2016-07-19 MED ORDER — LABETALOL HCL 5 MG/ML IV SOLN
20.0000 mg | INTRAVENOUS | Status: DC | PRN
  Filled 2016-07-19: qty 8

## 2016-07-19 MED ORDER — MAGNESIUM SULFATE 50 % IJ SOLN
INTRAVENOUS | Status: AC
  Administered 2016-07-19: 2 g/h via INTRAVENOUS
  Filled 2016-07-19: qty 80

## 2016-07-19 MED ORDER — MAGNESIUM SULFATE BOLUS VIA INFUSION
4.0000 g | Freq: Once | INTRAVENOUS | Status: AC
  Administered 2016-07-19: 4 g via INTRAVENOUS

## 2016-07-19 MED ORDER — HYDRALAZINE HCL 20 MG/ML IJ SOLN: 5.0000 mg | INTRAMUSCULAR | Status: DC | PRN

## 2016-07-19 MED ORDER — MAGNESIUM SULFATE 50 % IJ SOLN
2.0000 g/h | INTRAVENOUS | Status: DC
  Administered 2016-07-19 – 2016-07-20 (×2): 2 g/h via INTRAVENOUS
  Filled 2016-07-19: qty 80

## 2016-07-19 NOTE — Progress Notes (Signed)
Labs reviewed notable for bump in Cr and significant drop in Hgb.  Will recheck Hgb, currently above transfusion threshold but if symptomatic may benefit from 1 unit of pRBC.  Results for orders placed or performed during the hospital encounter of 07/17/16 (from the past 24 hour(s))  CBC     Status: Abnormal   Collection Time: 07/19/16  9:56 AM  Result Value Ref Range   WBC 10.1 3.6 - 11.0 K/uL   RBC 2.56 (L) 3.80 - 5.20 MIL/uL   Hemoglobin 7.3 (L) 12.0 - 16.0 g/dL   HCT 21.7 (L) 35.0 - 47.0 %   MCV 84.8 80.0 - 100.0 fL   MCH 28.6 26.0 - 34.0 pg   MCHC 33.7 32.0 - 36.0 g/dL   RDW 13.8 11.5 - 14.5 %   Platelets 178 150 - 440 K/uL  Comprehensive metabolic panel     Status: Abnormal   Collection Time: 07/19/16  9:56 AM  Result Value Ref Range   Sodium 139 135 - 145 mmol/L   Potassium 3.2 (L) 3.5 - 5.1 mmol/L   Chloride 110 101 - 111 mmol/L   CO2 22 22 - 32 mmol/L   Glucose, Bld 121 (H) 65 - 99 mg/dL   BUN 11 6 - 20 mg/dL   Creatinine, Ser 0.93 0.44 - 1.00 mg/dL   Calcium 8.6 (L) 8.9 - 10.3 mg/dL   Total Protein 5.0 (L) 6.5 - 8.1 g/dL   Albumin 2.3 (L) 3.5 - 5.0 g/dL   AST 32 15 - 41 U/L   ALT 26 14 - 54 U/L   Alkaline Phosphatase 96 38 - 126 U/L   Total Bilirubin <0.1 (L) 0.3 - 1.2 mg/dL   GFR calc non Af Amer >60 >60 mL/min   GFR calc Af Amer >60 >60 mL/min   Anion gap 7 5 - 15

## 2016-07-19 NOTE — Evaluation (Signed)
Physical Therapy Evaluation Patient Details Name: Pamela Palmer MRN: [id number] DOB: [birth year] Today's Date: 07/19/2016   History of Present Illness  30 y/o with history of CP and L total hip replacement X2, she uses a walker at baseline.  She had a normal delivery 12/13.  Clinical Impression  Pt was able to do some "prolonged" ambulation (~150 ft) with heavy reliance on walker and significant fatigue.  She did display decreased static standing balance and appears to have definite need of UEs to remain safe.  Discussed with pt and husband that she will need to be extra deliberate initially and together they will have have figure out the safest and most efficient way for them to transport and care for the baby.  We spent a lot of time discussing and trouble shooting possible scenarios and making suggestions for positioning/equipment/strategies.  Pt may benefit directly from [place] given her own functional limitations as well her her new needs as a mother.    Follow Up Recommendations Home health PT    Equipment Recommendations   (per safety regarding new child care, wheel chair may help?)    Recommendations for Other Services       Precautions / Restrictions Precautions Precautions: Fall Restrictions Weight Bearing Restrictions: No      Mobility  Bed Mobility Overal bed mobility: Independent             General bed mobility comments: Pt did need rals to get in/out of bed, but did so w/o assist  Transfers Overall transfer level: Independent Equipment used: Rolling walker (2 wheeled)             General transfer comment: Pt was able to rise at EOB with walker and no direct assist  Ambulation/Gait Ambulation/Gait assistance: Supervision Ambulation Distance (Feet): 150 Feet Assistive device: Rolling walker (2 wheeled)       General Gait Details: Pt with slow, cautious gait.  She had clear reliance on UEs and though some of her fatigue may have been attributable to  head ache she did appear to generally get tired and needed to rest.  Her O2 stayed in the high/mid 90s with the effort, but HR increased to 130s.  Pt was able to take a few steps w/ only single UE on rail, but did not feel very confident.  Stairs            Wheelchair Mobility    Modified Rankin (Stroke Patients Only)       Balance Overall balance assessment: Needs assistance   Sitting balance-Leahy Scale: Good       Standing balance-Leahy Scale: Fair Standing balance comment: Pt with definite need of UE support with static standing, discussed implications with holding/transfering/caring for baby                             Pertinent Vitals/Pain Pain Assessment:  (expected post labor pains as well as headaches no L hip pain)    Home Living Family/patient expects to be discharged to:: Private residence Living Arrangements: Spouse/significant other Available Help at Discharge: Family (husband works, but states he can be off work initially)   Civil Service fast streamer: Stairs to enter Entrance Stairs-Rails: None Technical brewer of Steps: 2 Home Layout: One level Home Equipment: Environmental consultant - 4 wheels;Walker - 2 wheels (apparently they have a basinet with wheels, 3-in-1 stroller)      Prior Function Level of Independence: Independent with assistive device(s)  Comments: Pt apparently only get out of the house with assist from huband, her CP s/s are worse in the cold and she uses walker most of the time.      Hand Dominance        Extremity/Trunk Assessment   Upper Extremity Assessment Upper Extremity Assessment: Generalized weakness;Defer to OT evaluation    Lower Extremity Assessment Lower Extremity Assessment: Generalized weakness (grossly 3+ to 4-/5 t/o, L generally weaker than R)       Communication   Communication: No difficulties  Cognition Arousal/Alertness:  (Pt is tired, but awake and able to participate) Behavior During Therapy: WFL for  tasks assessed/performed Overall Cognitive Status: Within Functional Limits for tasks assessed                      General Comments      Exercises     Assessment/Plan    PT Assessment Patient needs continued PT services  PT Problem List Decreased strength;Decreased activity tolerance;Decreased balance;Decreased safety awareness;Decreased knowledge of use of DME          PT Treatment Interventions DME instruction;Gait training;Stair training;Functional mobility training;Therapeutic activities;Therapeutic exercise;Balance training;Patient/family education    PT Goals (Current goals can be found in the Care Plan section)  Acute Rehab PT Goals Patient Stated Goal: go home and care for the baby PT Goal Formulation: With patient/family Time For Goal Achievement: 07/26/16 Potential to Achieve Goals: Good    Frequency Min 2X/week   Barriers to discharge        Co-evaluation               End of Session Equipment Utilized During Treatment: Gait belt Activity Tolerance: Patient limited by fatigue Patient left: with call bell/phone within reach;in bed;with nursing/sitter in room Nurse Communication: Mobility status         Time: [id number] PT Time Calculation (min) (ACUTE ONLY): 43 min   Charges:   PT Evaluation $PT Eval Moderate Complexity: 1 Procedure PT Treatments $Therapeutic Activity: 8-22 mins   PT G Codes:        Kreg Shropshire, DPT 07/19/2016, 12:01 PM

## 2016-07-19 NOTE — Clinical Social Work Maternal (Signed)
  CLINICAL SOCIAL WORK MATERNAL/CHILD NOTE  Patient Details  Name: Pamela Palmer MRN: [id number] Date of Birth: 1986/04/15  Date:  07/19/2016  Clinical Social Worker Initiating Note:  Shela Leff MSW,LCSW Date/ Time Initiated:  07/19/16/      Child's Name:      Legal Guardian:  Mother   Need for Interpreter:  None   Date of Referral:        Reason for Referral:   (Request for [place])   Referral Source:   (OT)   Address:     Phone number:      Household Members:  Spouse   Natural Supports (not living in the home):      Professional Supports: None   Employment: Unemployed   Type of Work:     Education:      Pensions consultant:  [employer]   Other Resources:      Cultural/Religious Considerations Which May Impact Care:  none  Strengths:  Compliance with medical plan , Home prepared for child    Risk Factors/Current Problems:   (patient with diagnosis)   Cognitive State:  Alert , Able to Concentrate , Goal Oriented    Mood/Affect:  Relaxed , Calm    CSW Assessment: CSW contacted by OT to inquire about a rolling crib due to patient's disability. CSW spoke with patient and her husband this morning. Father of baby was holding baby while sitting in chair at time of assessment. CSW explained role and purpose of visit. Patient states that they have all necessities for their newborn and stated that a rolling crib would be nice but in the meantime they do have a rolling bassinet that they will use. Patient's husband reports that he will be off of work until Thursday and that he can alter his work schedule and his return depending on how patient is doing. Patient's husband also states that they have a lot of support from friends. Nothing further needed at this time.    CSW Plan/Description:  No Further Intervention Required/No Barriers to Discharge    Shela Leff, LCSW 07/19/2016, 11:07 AM

## 2016-07-19 NOTE — Progress Notes (Signed)
OT Cancellation Note  Patient Details Name: Pamela Palmer MRN: [id number] DOB: May 12, 1986   Cancelled Treatment:     Orders received for OT to assist with obtaining rolling crib for patient.  Patient also received orders for PT eval, PT saw patient and made recommendations.  OT went to check status and patient was transferred from room 337 to [facility] for magnesium.  OT called social work and talked with nurse case manager, Brayton Layman who reported she will check and see if there are any rolling pack and plays in the closet but they will likely not have access to a rolling crib.   Although patient may be at her baseline physically, she does have a status and role change with the birth of a baby which is impacted by her limitations from CP (mobility status and balance to now care for an infant). She will be home alone during the day while her husband works.  Optimally, She would benefit from home health to assess her home situation and provide recommendations and training in her own environment with the equipment she currently has.  Updated nurse, Phineas Real on this information. Please reconsult if we can be of further service.  Amy T Lovett, OTR/L, CLT   Lovett,Amy 07/19/2016, 11:03 AM

## 2016-07-19 NOTE — Progress Notes (Signed)
Subjective:  Still reporting headaches as her main complaint, not improved since delivery.  No vision changes, RUQ or epigastric pain.  Appropriate lochia  Objective:   Blood pressure 135/73, pulse 88, temperature 98.4 F (36.9 C), temperature source Oral, resp. rate 18, height 5\' 7"  (1.702 m), weight 220 lb (99.8 kg), SpO2 100 %, unknown if currently breastfeeding.  General: NAD Pulmonary: no increased work of breathing Abdomen: non-distended, non-tender, fundus firm at level of umbilicus Extremities: no edema, no erythema, no tenderness  Results for orders placed or performed during the hospital encounter of 07/17/16 (from the past 72 hour(s))  CBC     Status: Abnormal   Collection Time: 07/17/16  2:20 PM  Result Value Ref Range   WBC 9.7 3.6 - 11.0 K/uL   RBC 4.08 3.80 - 5.20 MIL/uL   Hemoglobin 11.5 (L) 12.0 - 16.0 g/dL   HCT 07/19/16 (L) 98.6 - 21.5 %   MCV 83.3 80.0 - 100.0 fL   MCH 28.1 26.0 - 34.0 pg   MCHC 33.8 32.0 - 36.0 g/dL   RDW 78.4 61.5 - 36.9 %   Platelets 224 150 - 440 K/uL  Type and screen [facility]     Status: None   Collection Time: 07/17/16  2:20 PM  Result Value Ref Range   ABO/RH(D) B POS    Antibody Screen NEG    Sample Expiration 07/20/2016   RPR     Status: None   Collection Time: 07/17/16  2:20 PM  Result Value Ref Range   RPR Ser Ql Non Reactive Non Reactive    Comment: (NOTE) Performed At: [facility] [address] [place], [place] [id number] MD Mila Homer   Comprehensive metabolic panel     Status: Abnormal   Collection Time: 07/17/16  2:20 PM  Result Value Ref Range   Sodium 136 135 - 145 mmol/L   Potassium 3.1 (L) 3.5 - 5.1 mmol/L   Chloride 107 101 - 111 mmol/L   CO2 17 (L) 22 - 32 mmol/L   Glucose, Bld 93 65 - 99 mg/dL   BUN 9 6 - 20 mg/dL   Creatinine, Ser 07/19/16 0.44 - 1.00 mg/dL   Calcium 9.1 8.9 - 6.91 mg/dL   Total Protein 6.8 6.5 - 8.1 g/dL   Albumin 3.0 (L) 3.5 - 5.0 g/dL   AST 26 15 - 41 U/L   ALT 29 14 - 54 U/L   Alkaline Phosphatase 157 (H) 38 - 126 U/L   Total Bilirubin 0.5 0.3 - 1.2 mg/dL   GFR calc non Af Amer >60 >60 mL/min   GFR calc Af Amer >60 >60 mL/min    Comment: (NOTE) The eGFR has been calculated using the CKD EPI equation. This calculation has not been validated in all clinical situations. eGFR's persistently <60 mL/min signify possible Chronic Kidney Disease.    Anion gap 12 5 - 15  Protein / creatinine ratio, urine     Status: Abnormal   Collection Time: 07/17/16  2:20 PM  Result Value Ref Range   Creatinine, Urine 147 mg/dL   Total Protein, Urine 36 mg/dL    Comment: NO NORMAL RANGE ESTABLISHED FOR THIS TEST   Protein Creatinine Ratio 0.24 (H) 0.00 - 0.15 mg/mg[Cre]    Assessment:   30 y.o. G1P1001 postpartum day # 1 TSVD, GHTN although with development of headache concern for severe preeclampsia  Plan:    1) Acute blood loss anemia - hemodynamically stable and  asymptomatic - po ferrous sulfate  2) --/--/B POS (12/22 1420) Charlynn Grimes Immune (05/08 0000) / Varicella Immune  3) GHTN - will repeat labs, given persistant headache as well as severl severe range BP's around the time of delivery will treat with 24-hr course of magnesium.  Discussed benefits outweigh risk as I also do not want her to have a fall or seizure with her hip replacement.   - start mag - repeat labs  4) CP - continue to work with PT OT to assess home needs  5) Disposition pending completion of 24-hr magnesium sulfate course

## 2016-07-20 LAB — CBC
HCT: 22 % — ABNORMAL LOW (ref 35.0–47.0)
Hemoglobin: 7.5 g/dL — ABNORMAL LOW (ref 12.0–16.0)
MCH: 28.5 pg (ref 26.0–34.0)
MCHC: 33.9 g/dL (ref 32.0–36.0)
MCV: 84.2 fL (ref 80.0–100.0)
PLATELETS: 194 10*3/uL (ref 150–440)
RBC: 2.61 MIL/uL — AB (ref 3.80–5.20)
RDW: 13.7 % (ref 11.5–14.5)
WBC: 8.7 10*3/uL (ref 3.6–11.0)

## 2016-07-20 NOTE — Progress Notes (Signed)
Admit Date: 07/17/2016 Today's Date: 07/20/2016  Post Partum Day 2  Subjective:  no complaints, tolerating PO and reports continued mild headache.  Pt has tolerated MgSO4 for last 22 hours with good UOP and stable BP and no other sx of preclampsia.  Objective: Temp:  [97.6 F (36.4 C)-98.5 F (36.9 C)] 98.3 F (36.8 C) (12/25 0704) Pulse Rate:  [86-109] 92 (12/25 0704) Resp:  [17-18] 17 (12/25 0704) BP: (95-142)/(45-79) 124/74 (12/25 0704) SpO2:  [93 %-100 %] 97 % (12/25 0704)  Physical Exam:  General: alert, cooperative and no distress Chest CTA B Heart- reg rate/rhythm Lochia: appropriate Uterine Fundus: firm Incision: none DVT Evaluation: No evidence of DVT seen on physical exam. Extr: 2 + DTRs, no clonus  Recent Labs  07/19/16 1909 07/20/16 0739  HGB 7.2* 7.5*  HCT 21.7* 22.0*    Assessment/Plan: Infant doing well  Will stop MgSO4 and transfer to floor, d/c foley too at that time and eventually taper off of IVF Hgb stable, iron and PNV, no transfusion Breastfeeding Plans IUD, vs condoms   LOS: 3 days   [place] 07/20/2016, 10:01 AM

## 2016-07-20 NOTE — Anesthesia Postprocedure Evaluation (Signed)
Anesthesia Post Note  Patient: KAYANA FOLDEN  Procedure(s) Performed: * No procedures listed *  Patient location during evaluation: L&D Anesthesia Type: Epidural Level of consciousness: awake and alert Pain management: pain level controlled Vital Signs Assessment: post-procedure vital signs reviewed and stable Respiratory status: spontaneous breathing, nonlabored ventilation and respiratory function stable Cardiovascular status: stable Postop Assessment: no backache and patient able to bend at knees Comments: Patient has cerebral palsy.  She feels that the motor and sensation in her legs is now at baseline.  She has been able to walk but she states that her head feels dizzy because she is on magnesium.  At this time, she has a headache which is non-positional that she feels that she has had throughout her entire labor. Per the obstetrical team's documentation, they feel that her headache is secondary to her elevated blood pressures.  She was instructed to contact our team or another doctor if her headache does not become controlled once her blood pressures are controlled. She voiced understanding.     Last Vitals:  Vitals:   07/20/16 0504 07/20/16 0604  BP: 127/70 128/69  Pulse: 92 89  Resp: 18 18  Temp: 36.7 C     Last Pain:  Vitals:   07/20/16 0504  TempSrc: Oral  PainSc:                  Precious Haws P[place]it[place]

## 2016-07-21 MED ORDER — FERROUS SULFATE 325 (65 FE) MG PO TABS: 325.0000 mg | ORAL_TABLET | Freq: Every day | ORAL | 3 refills | Status: AC

## 2016-07-21 NOTE — Discharge Instructions (Signed)
Please call your doctor or return to the ER if you experience any chest pains, shortness of breath, fever greater than 101, any heavy bleeding (saturating more than 1 pad per hour), large clots, or foul smelling discharge, any worsening abdominal pain and cramping that is not controlled by pain medication, or any signs of postpartum depression. No tampons, enemas, douches, or sexual intercourse for 6 weeks. Also avoid tub baths, hot tubs, or swimming for 6 weeks.    Vaginal Delivery, Care After Refer to this sheet in the next few weeks. These instructions provide you with information about caring for yourself after vaginal delivery. Your health care provider may also give you more specific instructions. Your treatment has been planned according to current medical practices, but problems sometimes occur. Call your health care provider if you have any problems or questions. What can I expect after the procedure? After vaginal delivery, it is common to have:  Some bleeding from your vagina.  Soreness in your abdomen, your vagina, and the area of skin between your vaginal opening and your anus (perineum).  Pelvic cramps.  Fatigue. Follow these instructions at home: Medicines  Take over-the-counter and prescription medicines only as told by your health care provider.  If you were prescribed an antibiotic medicine, take it as told by your health care provider. Do not stop taking the antibiotic until it is finished. Driving  Do not drive or operate heavy machinery while taking prescription pain medicine.  Do not drive for 24 hours if you received a sedative. Lifestyle  Do not drink alcohol. This is especially important if you are breastfeeding or taking medicine to relieve pain.  Do not use tobacco products, including cigarettes, chewing tobacco, or e-cigarettes. If you need help quitting, ask your health care provider. Eating and drinking  Drink at least 8 eight-ounce glasses of water  every day unless you are told not to by your health care provider. If you choose to breastfeed your baby, you may need to drink more water than this.  Eat high-fiber foods every day. These foods may help prevent or relieve constipation. High-fiber foods include:  Whole grain cereals and breads.  Brown rice.  Beans.  Fresh fruits and vegetables. Activity  Return to your normal activities as told by your health care provider. Ask your health care provider what activities are safe for you.  Rest as much as possible. Try to rest or take a nap when your baby is sleeping.  Do not lift anything that is heavier than your baby or 10 lb (4.5 kg) until your health care provider says that it is safe.  Talk with your health care provider about when you can engage in sexual activity. This may depend on your:  Risk of infection.  Rate of healing.  Comfort and desire to engage in sexual activity. Vaginal Care  If you have an episiotomy or a vaginal tear, check the area every day for signs of infection. Check for:  More redness, swelling, or pain.  More fluid or blood.  Warmth.  Pus or a bad smell.  Do not use tampons or douches until your health care provider says this is safe.  Watch for any blood clots that may pass from your vagina. These may look like clumps of dark red, brown, or black discharge. General instructions  Keep your perineum clean and dry as told by your health care provider.  Wear loose, comfortable clothing.  Wipe from front to back when you use the toilet.  Ask your health care provider if you can shower or take a bath. If you had an episiotomy or a perineal tear during labor and delivery, your health care provider may tell you not to take baths for a certain length of time.  Wear a bra that supports your breasts and fits you well.  If possible, have someone help you with household activities and help care for your baby for at least a few days after you leave  the hospital.  Keep all follow-up visits for you and your baby as told by your health care provider. This is important. Contact a health care provider if:  You have:  Vaginal discharge that has a bad smell.  Difficulty urinating.  Pain when urinating.  A sudden increase or decrease in the frequency of your bowel movements.  More redness, swelling, or pain around your episiotomy or vaginal tear.  More fluid or blood coming from your episiotomy or vaginal tear.  Pus or a bad smell coming from your episiotomy or vaginal tear.  A fever.  A rash.  Little or no interest in activities you used to enjoy.  Questions about caring for yourself or your baby.  Your episiotomy or vaginal tear feels warm to the touch.  Your episiotomy or vaginal tear is separating or does not appear to be healing.  Your breasts are painful, hard, or turn red.  You feel unusually sad or worried.  You feel nauseous or you vomit.  You pass large blood clots from your vagina. If you pass a blood clot from your vagina, save it to show to your health care provider. Do not flush blood clots down the toilet without having your health care provider look at them.  You urinate more than usual.  You are dizzy or light-headed.  You have not breastfed at all and you have not had a menstrual period for 12 weeks after delivery.  You have stopped breastfeeding and you have not had a menstrual period for 12 weeks after you stopped breastfeeding. Get help right away if:  You have:  Pain that does not go away or does not get better with medicine.  Chest pain.  Difficulty breathing.  Blurred vision or spots in your vision.  Thoughts about hurting yourself or your baby.  You develop pain in your abdomen or in one of your legs.  You develop a severe headache.  You faint.  You bleed from your vagina so much that you fill two sanitary pads in one hour. This information is not intended to replace advice  given to you by your health care provider. Make sure you discuss any questions you have with your health care provider. Document Released: 07/10/2000 Document Revised: 12/25/2015 Document Reviewed: 07/28/2015 Elsevier Interactive Patient Education  2017 [place]. Discharge instructions:   Call office if you have any of the following: headache, visual changes, fever >100 F, chills, breast concerns, excessive vaginal bleeding, incision drainage or problems, leg pain or redness, depression or any other concerns.   Activity: Do not lift > 10 lbs for 6 weeks.  No intercourse or tampons for 6 weeks.  No driving for 1-2 weeks.

## 2016-07-21 NOTE — Progress Notes (Signed)
Discharge order received from doctor. PT evaluated patient prior to discharge. Reviewed discharge instructions and prescriptions with patient and answered all questions. Follow up appointment instructions given. Patient verbalized understanding. ID bands checked. Patient discharged home with infant via wheelchair by nursing/auxillary.   Hilbert Bible, RN

## 2016-07-21 NOTE — Progress Notes (Signed)
Physical Therapy Treatment Patient Details Name: REGGIE GABHART MRN: [id number] DOB: 13-May-1986 Today's Date: 07/21/2016    History of Present Illness 30 y/o with history of CP and L total hip replacement X2, she uses a walker at baseline.  She had a normal delivery 12/13.    PT Comments    PT received call to re-assess patient prior to discharge today.  Pt able to perform bed mobility, transfers with RW (including toileting), and ambulate around nursing station with RW without any physical assist; no loss of balance noted during session; some increased effort and time to perform activities noted.  Pt does rely heavily upon UE's on RW with ambulation and standing (d/t LE pain and weakness) and currently pt does not appear safe to walk and carry a baby at the same time.  Pt reports having a bassinet on wheels but d/t pt's heavy UE use on RW this does not appear to be a safe option (would tip bassinet if putting weight through UE's on it).  Discussed use of baby carrier on body (so UE's free to use RW) but to work towards this (not perform initially) d/t this would add weight (by carrying baby) and pt already using a lot of UE support on RW to off-weight weak and painful LE's and pt also reporting UE's getting fatigued from using walker (d/t not walking as much as normal and not bearing weight through UE's as much since having the baby).  Discussed getting one spot set up in home for the baby so pt would not need to carry the baby anywhere initially for safety.  Pt and pt's husband educated on above and both verbalizing good understanding; also discussed stairs and pt and pt's husband report pt normally holds onto husband to perform stairs and they have their technique down since her hip surgeries and they felt comfortable with their technique and declined to trial stairs in hospital.   Follow Up Recommendations  Home health PT     Equipment Recommendations  Rolling walker with 5" wheels (pt  already owns RW)    Recommendations for Other Services       Precautions / Restrictions Precautions Precautions: Fall Restrictions Weight Bearing Restrictions: No    Mobility  Bed Mobility Overal bed mobility: Independent             General bed mobility comments: supine to/from sit with bed flat; no use of rails  Transfers Overall transfer level: Modified independent Equipment used: Rolling walker (2 wheeled)             General transfer comment: Sit to/from stand from bed and toilet with mild increased effort but no physical assist required  Ambulation/Gait Ambulation/Gait assistance: Supervision Ambulation Distance (Feet): 200 Feet Assistive device: Rolling walker (2 wheeled)       General Gait Details: Decreased cadance; increased UE support through RW; B LE's externally rotated with mild increased B lateral sway   Stairs Stairs:  (pt and pt's husband declined to trial stairs in hospital)          Wheelchair Mobility    Modified Rankin (Stroke Patients Only)       Balance Overall balance assessment: Needs assistance Sitting-balance support: No upper extremity supported;Feet supported Sitting balance-Leahy Scale: Normal     Standing balance support: Bilateral upper extremity supported Standing balance-Leahy Scale: Fair Standing balance comment: Need of UE support with standing  Cognition Arousal/Alertness: Awake/alert Behavior During Therapy: WFL for tasks assessed/performed Overall Cognitive Status: Within Functional Limits for tasks assessed                      Exercises      General Comments General comments (skin integrity, edema, etc.): Pt's husband present for session.  Nursing cleared pt for participation in physical therapy.  Pt agreeable to PT session.      Pertinent Vitals/Pain Pain Assessment: 0-10 Pain Score: 6  Pain Location: R inner thigh Pain Descriptors / Indicators: Sore Pain  Intervention(s): Limited activity within patient's tolerance;Monitored during session;Repositioned  Vitals (HR and O2) stable and WFL throughout treatment session.    Home Living                      Prior Function            PT Goals (current goals can now be found in the care plan section) Acute Rehab PT Goals Patient Stated Goal: go home and care for the baby PT Goal Formulation: With patient/family Time For Goal Achievement: 07/26/16 Potential to Achieve Goals: Good Progress towards PT goals: Progressing toward goals    Frequency    Min 2X/week      PT Plan Current plan remains appropriate    Co-evaluation             End of Session Equipment Utilized During Treatment: Gait belt Activity Tolerance: Patient limited by fatigue Patient left: in bed;with call bell/phone within reach;with family/visitor present     Time: 0920-0955 PT Time Calculation (min) (ACUTE ONLY): 35 min  Charges:  $Gait Training: 8-22 mins $Therapeutic Activity: 8-22 mins                    G CodesLeitha Bleak 2016/08/03, 12:33 PM Leitha Bleak, [place]

## 2016-07-21 NOTE — Progress Notes (Signed)
Admit Date: 07/17/2016 Today's Date: 07/21/2016  Post Partum Day 3  Subjective:  no complaints, tolerating PO   Objective: Temp:  [97.7 F (36.5 C)-98.4 F (36.9 C)] 98 F (36.7 C) (12/26 0730) Pulse Rate:  [66-97] 83 (12/26 0730) Resp:  [16-18] 18 (12/26 0730) BP: (121-141)/(66-84) 121/78 (12/26 0730) SpO2:  [97 %-100 %] 100 % (12/26 0730)  Physical Exam:  General: alert, cooperative and no distress Lochia: appropriate Uterine Fundus: firm Incision: none DVT Evaluation: No evidence of DVT seen on physical exam. Extr: 2 + DTRs, no clonus  Recent Labs  07/19/16 1909 07/20/16 0739  HGB 7.2* 7.5*  HCT 21.7* 22.0*    Assessment/Plan: Infant doing well  Hgb stable, iron and PNV, no transfusion Breastfeeding Plans  condoms   LOS: 4 days   [place] 07/21/2016, 8:48 AM

## 2016-07-25 NOTE — Discharge Summary (Signed)
L&D OB Triage Note  Pamela Palmer is a 30 y.o. G1P1001 female at [redacted]w[redacted]d, EDD Estimated Date of Delivery: 07/28/16 who presented to triage for complaints of contractions. Contractions have been ongoing x 1 week, getting stronger today.  Also reported losing mucus plug.  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 She was evaluated by the nurses with no significant findings for labor. Vital signs stable. An NST was performed and has been reviewed by MD.    Physical Exam:  Blood pressure 132/82, pulse 77, temperature 98.3 F (36.8 C), temperature source Oral, resp. rate 18, height 5\' 7"  (1.702 m), weight 220 lb (99.8 kg), unknown if currently breastfeeding.  Cervix: 1/30/-2 (essentially unchanged at next check). Per nurse exam.   NST INTERPRETATION: Indications: rule out uterine contractions  Mode: External (removed - pt. d/c home) Baseline Rate (A): 130 bpm Variability: Moderate Accelerations: 15 x 15 Decelerations: None     Contraction Frequency (min): 11-12  Impression: reactive   Plan: NST performed was reviewed and was found to be reactive. She was discharged home with bleeding/labor precautions.  Continue routine prenatal care. Follow up with OB/GYN as previously scheduled.     Rubie Maid, MD [facility]

## 2016-10-26 ENCOUNTER — Ambulatory Visit (INDEPENDENT_AMBULATORY_CARE_PROVIDER_SITE_OTHER): Payer: Medicaid Other | Admitting: Obstetrics and Gynecology

## 2016-10-26 ENCOUNTER — Encounter: Payer: Self-pay | Admitting: Obstetrics and Gynecology

## 2016-10-26 VITALS — BP 128/86 | Ht 67.0 in | Wt 188.0 lb

## 2016-10-26 DIAGNOSIS — O99345 Other mental disorders complicating the puerperium: Principal | ICD-10-CM

## 2016-10-26 DIAGNOSIS — F53 Postpartum depression: Secondary | ICD-10-CM

## 2016-10-26 MED ORDER — FLUOXETINE HCL 20 MG PO CAPS: 60.0000 mg | ORAL_CAPSULE | Freq: Every day | ORAL | 2 refills | Status: DC

## 2016-10-26 NOTE — Patient Instructions (Signed)
Fluoxetine capsules or tablets (Depression/Mood Disorders) What is this medicine? FLUOXETINE (floo OX e teen) belongs to a class of drugs known as selective serotonin reuptake inhibitors (SSRIs). It helps to treat mood problems such as depression, obsessive compulsive disorder, and panic attacks. It can also treat certain eating disorders. This medicine may be used for other purposes; ask your health care provider or pharmacist if you have questions. COMMON BRAND NAME(S): Prozac What should I tell my health care provider before I take this medicine? They need to know if you have any of these conditions: -bipolar disorder or a family history of bipolar disorder -bleeding disorders -glaucoma -heart disease -liver disease -low levels of sodium in the blood -seizures -suicidal thoughts, plans, or attempt; a previous suicide attempt by you or a family member -take MAOIs like Carbex, Eldepryl, Marplan, Nardil, and Parnate -take medicines that treat or prevent blood clots -thyroid disease -an unusual or allergic reaction to fluoxetine, other medicines, foods, dyes, or preservatives -pregnant or trying to get pregnant -breast-feeding How should I use this medicine? Take this medicine by mouth with a glass of water. Follow the directions on the prescription label. You can take this medicine with or without food. Take your medicine at regular intervals. Do not take it more often than directed. Do not stop taking this medicine suddenly except upon the advice of your doctor. Stopping this medicine too quickly may cause serious side effects or your condition may worsen. A special MedGuide will be given to you by the pharmacist with each prescription and refill. Be sure to read this information carefully each time. Talk to your pediatrician regarding the use of this medicine in children. While this drug may be prescribed for children as young as 7 years for selected conditions, precautions do  apply. Overdosage: If you think you have taken too much of this medicine contact a poison control center or emergency room at once. NOTE: This medicine is only for you. Do not share this medicine with others. What if I miss a dose? If you miss a dose, skip the missed dose and go back to your regular dosing schedule. Do not take double or extra doses. What may interact with this medicine? Do not take this medicine with any of the following medications: -other medicines containing fluoxetine, like Sarafem or Symbyax -cisapride -linezolid -MAOIs like Carbex, Eldepryl, Marplan, Nardil, and Parnate -methylene blue (injected into a vein) -pimozide -thioridazine This medicine may also interact with the following medications: -alcohol -amphetamines -aspirin and aspirin-like medicines -carbamazepine -certain medicines for depression, anxiety, or psychotic disturbances -certain medicines for migraine headaches like almotriptan, eletriptan, frovatriptan, naratriptan, rizatriptan, sumatriptan, zolmitriptan -digoxin -diuretics -fentanyl -flecainide -furazolidone -isoniazid -lithium -medicines for sleep -medicines that treat or prevent blood clots like warfarin, enoxaparin, and dalteparin -NSAIDs, medicines for pain and inflammation, like ibuprofen or naproxen -phenytoin -procarbazine -propafenone -rasagiline -ritonavir -supplements like St. John's wort, kava kava, valerian -tramadol -tryptophan -vinblastine This list may not describe all possible interactions. Give your health care provider a list of all the medicines, herbs, non-prescription drugs, or dietary supplements you use. Also tell them if you smoke, drink alcohol, or use illegal drugs. Some items may interact with your medicine. What should I watch for while using this medicine? Tell your doctor if your symptoms do not get better or if they get worse. Visit your doctor or health care professional for regular checks on your  progress. Because it may take several weeks to see the full effects of this medicine, it   is important to continue your treatment as prescribed by your doctor. Patients and their families should watch out for new or worsening thoughts of suicide or depression. Also watch out for sudden changes in feelings such as feeling anxious, agitated, panicky, irritable, hostile, aggressive, impulsive, severely restless, overly excited and hyperactive, or not being able to sleep. If this happens, especially at the beginning of treatment or after a change in dose, call your health care professional. You may get drowsy or dizzy. Do not drive, use machinery, or do anything that needs mental alertness until you know how this medicine affects you. Do not stand or sit up quickly, especially if you are an older patient. This reduces the risk of dizzy or fainting spells. Alcohol may interfere with the effect of this medicine. Avoid alcoholic drinks. Your mouth may get dry. Chewing sugarless gum or sucking hard candy, and drinking plenty of water may help. Contact your doctor if the problem does not go away or is severe. This medicine may affect blood sugar levels. If you have diabetes, check with your doctor or health care professional before you change your diet or the dose of your diabetic medicine. What side effects may I notice from receiving this medicine? Side effects that you should report to your doctor or health care professional as soon as possible: -allergic reactions like skin rash, itching or hives, swelling of the face, lips, or tongue -anxious -black, tarry stools -breathing problems -changes in vision -confusion -elevated mood, decreased need for sleep, racing thoughts, impulsive behavior -eye pain -fast, irregular heartbeat -feeling faint or lightheaded, falls -feeling agitated, angry, or irritable -hallucination, loss of contact with reality -loss of balance or coordination -loss of memory -painful  or prolonged erections -restlessness, pacing, inability to keep still -seizures -stiff muscles -suicidal thoughts or other mood changes -trouble sleeping -unusual bleeding or bruising -unusually weak or tired -vomiting Side effects that usually do not require medical attention (report to your doctor or health care professional if they continue or are bothersome): -change in appetite or weight -change in sex drive or performance -diarrhea -dry mouth -headache -increased sweating -nausea -tremors This list may not describe all possible side effects. Call your doctor for medical advice about side effects. You may report side effects to FDA at 1-800-FDA-1088. Where should I keep my medicine? Keep out of the reach of children. Store at room temperature between 15 and 30 degrees C (59 and 86 degrees F). Throw away any unused medicine after the expiration date. NOTE: This sheet is a summary. It may not cover all possible information. If you have questions about this medicine, talk to your doctor, pharmacist, or health care provider.  2018 Elsevier/Gold Standard (2015-12-14 15:55:27)  
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
## 2016-11-25 ENCOUNTER — Encounter: Payer: Self-pay | Admitting: Obstetrics and Gynecology

## 2016-11-25 ENCOUNTER — Ambulatory Visit (INDEPENDENT_AMBULATORY_CARE_PROVIDER_SITE_OTHER): Payer: Medicaid Other | Admitting: Obstetrics and Gynecology

## 2016-11-25 VITALS — BP 120/64 | HR 69 | Ht 67.0 in | Wt 188.0 lb

## 2016-11-25 DIAGNOSIS — F53 Postpartum depression: Secondary | ICD-10-CM

## 2016-11-25 DIAGNOSIS — O99345 Other mental disorders complicating the puerperium: Principal | ICD-10-CM

## 2016-11-25 MED ORDER — FLUOXETINE HCL 40 MG PO CAPS: 40.0000 mg | ORAL_CAPSULE | Freq: Every day | ORAL | 11 refills | Status: DC
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
## 2017-03-16 ENCOUNTER — Emergency Department: Payer: Medicaid Other

## 2017-03-16 ENCOUNTER — Encounter: Payer: Self-pay | Admitting: Emergency Medicine

## 2017-03-16 ENCOUNTER — Emergency Department
Admission: EM | Admit: 2017-03-16 | Discharge: 2017-03-16 | Disposition: A | Discharge status: Home / Self Care | Payer: Medicaid Other | Attending: Emergency Medicine | Admitting: Emergency Medicine

## 2017-03-16 DIAGNOSIS — Z79899 Other long term (current) drug therapy: Secondary | ICD-10-CM | POA: Diagnosis not present

## 2017-03-16 DIAGNOSIS — H60391 Other infective otitis externa, right ear: Secondary | ICD-10-CM | POA: Insufficient documentation

## 2017-03-16 DIAGNOSIS — G809 Cerebral palsy, unspecified: Secondary | ICD-10-CM | POA: Insufficient documentation

## 2017-03-16 DIAGNOSIS — R200 Anesthesia of skin: Secondary | ICD-10-CM | POA: Insufficient documentation

## 2017-03-16 DIAGNOSIS — E039 Hypothyroidism, unspecified: Secondary | ICD-10-CM | POA: Insufficient documentation

## 2017-03-16 DIAGNOSIS — R5383 Other fatigue: Secondary | ICD-10-CM | POA: Diagnosis not present

## 2017-03-16 DIAGNOSIS — J45909 Unspecified asthma, uncomplicated: Secondary | ICD-10-CM | POA: Diagnosis not present

## 2017-03-16 DIAGNOSIS — Z96642 Presence of left artificial hip joint: Secondary | ICD-10-CM | POA: Diagnosis not present

## 2017-03-16 DIAGNOSIS — F449 Dissociative and conversion disorder, unspecified: Secondary | ICD-10-CM | POA: Diagnosis not present

## 2017-03-16 DIAGNOSIS — R4182 Altered mental status, unspecified: Secondary | ICD-10-CM | POA: Diagnosis present

## 2017-03-16 LAB — BASIC METABOLIC PANEL
ANION GAP: 7 (ref 5–15)
BUN: 11 mg/dL (ref 6–20)
CO2: 26 mmol/L (ref 22–32)
Calcium: 9.1 mg/dL (ref 8.9–10.3)
Chloride: 106 mmol/L (ref 101–111)
Creatinine, Ser: 0.68 mg/dL (ref 0.44–1.00)
GFR calc Af Amer: >60 mL/min (ref >60)
Glucose, Bld: 92 mg/dL (ref 65–99)
POTASSIUM: 3.4 mmol/L — AB (ref 3.5–5.1)
SODIUM: 139 mmol/L (ref 135–145)

## 2017-03-16 LAB — URINALYSIS, COMPLETE (UACMP) WITH MICROSCOPIC
BACTERIA UA: NONE SEEN
BILIRUBIN URINE: NEGATIVE
GLUCOSE, UA: NEGATIVE mg/dL
HGB URINE DIPSTICK: NEGATIVE
Ketones, ur: NEGATIVE mg/dL
LEUKOCYTES UA: NEGATIVE
NITRITE: NEGATIVE
PH: 7 (ref 5.0–8.0)
Protein, ur: NEGATIVE mg/dL
SPECIFIC GRAVITY, URINE: 1.012 (ref 1.005–1.030)

## 2017-03-16 LAB — CBC
HEMATOCRIT: 38 % (ref 35.0–47.0)
Hemoglobin: 12.6 g/dL (ref 12.0–16.0)
MCH: 27.8 pg (ref 26.0–34.0)
MCHC: 33.2 g/dL (ref 32.0–36.0)
MCV: 83.6 fL (ref 80.0–100.0)
Platelets: 264 10*3/uL (ref 150–440)
RBC: 4.54 MIL/uL (ref 3.80–5.20)
RDW: 13 % (ref 11.5–14.5)
WBC: 7.3 10*3/uL (ref 3.6–11.0)

## 2017-03-16 LAB — GLUCOSE, CAPILLARY: GLUCOSE-CAPILLARY: 88 mg/dL (ref 65–99)

## 2017-03-16 MED ORDER — SODIUM CHLORIDE 0.9 % IV BOLUS (SEPSIS)
1000.0000 mL | Freq: Once | INTRAVENOUS | Status: AC
Start: 2017-03-16 — End: 2017-03-16
  Administered 2017-03-16: 1000 mL via INTRAVENOUS

## 2017-03-16 MED ORDER — NEOMYCIN-POLYMYXIN-HC 3.5-10000-1 OT SOLN: 3.0000 [drp] | Freq: Three times a day (TID) | OTIC | 0 refills | Status: DC

## 2017-03-16 MED ORDER — ACETAMINOPHEN 500 MG PO TABS
1000.0000 mg | ORAL_TABLET | Freq: Once | ORAL | Status: AC
  Administered 2017-03-16: 1000 mg via ORAL
  Filled 2017-03-16: qty 2

## 2017-03-16 NOTE — ED Notes (Addendum)
Pt placed on bedpan.  Ear irrigation supplies setup at bedside for Dr. Joni Fears.

## 2017-03-16 NOTE — ED Notes (Signed)
Pt returned from CT °

## 2017-03-16 NOTE — ED Notes (Signed)
Pt states feels like she still needs to urinate.  Placed back on bedpan.

## 2017-03-16 NOTE — ED Notes (Signed)
Patient used bedpan.

## 2017-03-16 NOTE — ED Notes (Signed)
Pt was given food and drink, pt was able to swallow both without distress. This EDT assisted pt to toilet with standby assistance, no problem transitioning other than slow to move. RN and MD notified

## 2017-03-16 NOTE — ED Notes (Signed)
Verbal order for head CT given by MD [facility]

## 2017-03-16 NOTE — ED Notes (Signed)
Patient placed on bedpan.

## 2017-03-16 NOTE — ED Triage Notes (Signed)
Pt to ED via pov with husband, states pt c/o numbness to RT side of her face this am aprox 0800, pt last normal was last night per family. Pt has hx of cp. Pt is slow to respond. Pt denies any narcotic use. Family states pt has been dealing with dental pain on same RT side
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
## 2017-03-16 NOTE — ED Notes (Signed)
Pt unable to urinate.  Taken off of bedpan.

## 2017-03-16 NOTE — ED Notes (Signed)
Pt up eating and drinking without difficulty.

## 2017-05-11 ENCOUNTER — Other Ambulatory Visit: Payer: Self-pay | Admitting: Otolaryngology

## 2017-05-11 DIAGNOSIS — D37030 Neoplasm of uncertain behavior of the parotid salivary glands: Secondary | ICD-10-CM

## 2017-05-11 DIAGNOSIS — H9041 Sensorineural hearing loss, unilateral, right ear, with unrestricted hearing on the contralateral side: Secondary | ICD-10-CM

## 2017-05-11 DIAGNOSIS — H9201 Otalgia, right ear: Secondary | ICD-10-CM

## 2017-05-11 DIAGNOSIS — IMO0001 Reserved for inherently not codable concepts without codable children: Secondary | ICD-10-CM

## 2017-05-17 ENCOUNTER — Ambulatory Visit
Admission: RE | Admit: 2017-05-17 | Discharge: 2017-05-17 | Disposition: A | Discharge status: Home / Self Care | Payer: Medicaid Other | Source: Ambulatory Visit | Attending: Otolaryngology | Admitting: Otolaryngology

## 2017-05-17 ENCOUNTER — Encounter: Payer: Self-pay | Admitting: Radiology

## 2017-05-17 DIAGNOSIS — D37030 Neoplasm of uncertain behavior of the parotid salivary glands: Secondary | ICD-10-CM

## 2017-05-17 DIAGNOSIS — H9041 Sensorineural hearing loss, unilateral, right ear, with unrestricted hearing on the contralateral side: Secondary | ICD-10-CM | POA: Diagnosis not present

## 2017-05-17 DIAGNOSIS — R221 Localized swelling, mass and lump, neck: Secondary | ICD-10-CM | POA: Insufficient documentation

## 2017-05-17 DIAGNOSIS — H9201 Otalgia, right ear: Secondary | ICD-10-CM | POA: Insufficient documentation

## 2017-05-17 DIAGNOSIS — H9313 Tinnitus, bilateral: Secondary | ICD-10-CM | POA: Insufficient documentation

## 2017-05-17 DIAGNOSIS — IMO0001 Reserved for inherently not codable concepts without codable children: Secondary | ICD-10-CM

## 2017-05-17 MED ORDER — GADOBENATE DIMEGLUMINE 529 MG/ML IV SOLN
17.0000 mL | Freq: Once | INTRAVENOUS | Status: AC | PRN
  Administered 2017-05-17: 17 mL via INTRAVENOUS

## 2017-09-30 ENCOUNTER — Ambulatory Visit: Payer: Self-pay | Admitting: Internal Medicine

## 2017-10-25 ENCOUNTER — Encounter: Payer: Self-pay | Admitting: Internal Medicine

## 2017-10-25 ENCOUNTER — Ambulatory Visit: Payer: Medicaid Other | Admitting: Internal Medicine

## 2017-10-25 VITALS — BP 132/88 | HR 89 | Resp 16 | Ht 67.0 in | Wt 198.0 lb

## 2017-10-25 DIAGNOSIS — Z9989 Dependence on other enabling machines and devices: Secondary | ICD-10-CM

## 2017-10-25 DIAGNOSIS — J301 Allergic rhinitis due to pollen: Secondary | ICD-10-CM

## 2017-10-25 DIAGNOSIS — G4733 Obstructive sleep apnea (adult) (pediatric): Secondary | ICD-10-CM

## 2017-10-25 DIAGNOSIS — J4520 Mild intermittent asthma, uncomplicated: Secondary | ICD-10-CM
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
## 2017-10-25 NOTE — Patient Instructions (Signed)

## 2017-12-09 ENCOUNTER — Other Ambulatory Visit: Payer: Self-pay | Admitting: Internal Medicine

## 2017-12-09 MED ORDER — ALBUTEROL SULFATE HFA 108 (90 BASE) MCG/ACT IN AERS: 2.0000 | INHALATION_SPRAY | Freq: Four times a day (QID) | RESPIRATORY_TRACT | 2 refills | Status: DC | PRN

## 2017-12-10 ENCOUNTER — Other Ambulatory Visit: Payer: Self-pay

## 2017-12-10 MED ORDER — ALBUTEROL SULFATE HFA 108 (90 BASE) MCG/ACT IN AERS: 2.0000 | INHALATION_SPRAY | Freq: Four times a day (QID) | RESPIRATORY_TRACT | 2 refills | Status: DC | PRN

## 2017-12-22 ENCOUNTER — Encounter: Payer: Self-pay | Admitting: Maternal Newborn

## 2017-12-31 ENCOUNTER — Ambulatory Visit (INDEPENDENT_AMBULATORY_CARE_PROVIDER_SITE_OTHER): Payer: Medicaid Other | Admitting: Maternal Newborn

## 2017-12-31 ENCOUNTER — Encounter: Payer: Self-pay | Admitting: Maternal Newborn

## 2017-12-31 VITALS — BP 120/90 | Wt 202.0 lb

## 2017-12-31 DIAGNOSIS — E039 Hypothyroidism, unspecified: Secondary | ICD-10-CM | POA: Diagnosis not present

## 2017-12-31 DIAGNOSIS — O99281 Endocrine, nutritional and metabolic diseases complicating pregnancy, first trimester: Secondary | ICD-10-CM | POA: Diagnosis not present

## 2017-12-31 DIAGNOSIS — Z113 Encounter for screening for infections with a predominantly sexual mode of transmission: Secondary | ICD-10-CM | POA: Diagnosis not present

## 2017-12-31 DIAGNOSIS — Z3689 Encounter for other specified antenatal screening: Secondary | ICD-10-CM | POA: Diagnosis not present

## 2017-12-31 DIAGNOSIS — O99341 Other mental disorders complicating pregnancy, first trimester: Secondary | ICD-10-CM

## 2017-12-31 DIAGNOSIS — Z6831 Body mass index (BMI) 31.0-31.9, adult: Secondary | ICD-10-CM

## 2017-12-31 DIAGNOSIS — Z3A01 Less than 8 weeks gestation of pregnancy: Secondary | ICD-10-CM

## 2017-12-31 DIAGNOSIS — F329 Major depressive disorder, single episode, unspecified: Secondary | ICD-10-CM | POA: Diagnosis not present

## 2017-12-31 DIAGNOSIS — O99340 Other mental disorders complicating pregnancy, unspecified trimester: Secondary | ICD-10-CM

## 2017-12-31 DIAGNOSIS — O0991 Supervision of high risk pregnancy, unspecified, first trimester: Secondary | ICD-10-CM

## 2017-12-31 DIAGNOSIS — O99280 Endocrine, nutritional and metabolic diseases complicating pregnancy, unspecified trimester: Secondary | ICD-10-CM

## 2017-12-31 MED ORDER — FLUOXETINE HCL 40 MG PO CAPS: 40.0000 mg | ORAL_CAPSULE | Freq: Every day | ORAL | 11 refills | Status: DC
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
## 2017-12-31 NOTE — Progress Notes (Signed)
C/O refill anxiety med; rx pnv;

## 2018-01-01 LAB — URINE DRUG PANEL 7
AMPHETAMINES, URINE: NEGATIVE ng/mL
BARBITURATE QUANT UR: NEGATIVE ng/mL
Benzodiazepine Quant, Ur: NEGATIVE ng/mL
CANNABINOID QUANT UR: NEGATIVE ng/mL
COCAINE (METAB.): NEGATIVE ng/mL
OPIATE QUANT UR: NEGATIVE ng/mL
PCP Quant, Ur: NEGATIVE ng/mL

## 2018-01-02 LAB — URINE CULTURE

## 2018-01-02 NOTE — Patient Instructions (Signed)
First Trimester of Pregnancy The first trimester of pregnancy is from week 1 until the end of week 13 (months 1 through 3). A week after a sperm fertilizes an egg, the egg will implant on the wall of the uterus. This embryo will begin to develop into a baby. Genes from you and your partner will form the baby. The female genes will determine whether the baby will be a boy or a girl. At 6-8 weeks, the eyes and face will be formed, and the heartbeat can be seen on ultrasound. At the end of 12 weeks, all the baby's organs will be formed. Now that you are pregnant, you will want to do everything you can to have a healthy baby. Two of the most important things are to get good prenatal care and to follow your health care provider's instructions. Prenatal care is all the medical care you receive before the baby's birth. This care will help prevent, find, and treat any problems during the pregnancy and childbirth. Body changes during your first trimester Your body goes through many changes during pregnancy. The changes vary from woman to woman.  You may gain or lose a couple of pounds at first.  You may feel sick to your stomach (nauseous) and you may throw up (vomit). If the vomiting is uncontrollable, call your health care provider.  You may tire easily.  You may develop headaches that can be relieved by medicines. All medicines should be approved by your health care provider.  You may urinate more often. Painful urination may mean you have a bladder infection.  You may develop heartburn as a result of your pregnancy.  You may develop constipation because certain hormones are causing the muscles that push stool through your intestines to slow down.  You may develop hemorrhoids or swollen veins (varicose veins).  Your breasts may begin to grow larger and become tender. Your nipples may stick out more, and the tissue that surrounds them (areola) may become darker.  Your gums may bleed and may be  sensitive to brushing and flossing.  Dark spots or blotches (chloasma, mask of pregnancy) may develop on your face. This will likely fade after the baby is born.  Your menstrual periods will stop.  You may have a loss of appetite.  You may develop cravings for certain kinds of food.  You may have changes in your emotions from day to day, such as being excited to be pregnant or being concerned that something may go wrong with the pregnancy and baby.  You may have more vivid and strange dreams.  You may have changes in your hair. These can include thickening of your hair, rapid growth, and changes in texture. Some women also have hair loss during or after pregnancy, or hair that feels dry or thin. Your hair will most likely return to normal after your baby is born.  What to expect at prenatal visits During a routine prenatal visit:  You will be weighed to make sure you and the baby are growing normally.  Your blood pressure will be taken.  Your abdomen will be measured to track your baby's growth.  The fetal heartbeat will be listened to between weeks 10 and 14 of your pregnancy.  Test results from any previous visits will be discussed.  Your health care provider may ask you:  How you are feeling.  If you are feeling the baby move.  If you have had any abnormal symptoms, such as leaking fluid, bleeding, severe headaches,   or abdominal cramping.  If you are using any tobacco products, including cigarettes, chewing tobacco, and electronic cigarettes.  If you have any questions.  Other tests that may be performed during your first trimester include:  Blood tests to find your blood type and to check for the presence of any previous infections. The tests will also be used to check for low iron levels (anemia) and protein on red blood cells (Rh antibodies). Depending on your risk factors, or if you previously had diabetes during pregnancy, you may have tests to check for high blood  sugar that affects pregnant women (gestational diabetes).  Urine tests to check for infections, diabetes, or protein in the urine.  An ultrasound to confirm the proper growth and development of the baby.  Fetal screens for spinal cord problems (spina bifida) and Down syndrome.  HIV (human immunodeficiency virus) testing. Routine prenatal testing includes screening for HIV, unless you choose not to have this test.  You may need other tests to make sure you and the baby are doing well.  Follow these instructions at home: Medicines  Follow your health care provider's instructions regarding medicine use. Specific medicines may be either safe or unsafe to take during pregnancy.  Take a prenatal vitamin that contains at least 600 micrograms (mcg) of folic acid.  If you develop constipation, try taking a stool softener if your health care provider approves. Eating and drinking  Eat a balanced diet that includes fresh fruits and vegetables, whole grains, good sources of protein such as meat, eggs, or tofu, and low-fat dairy. Your health care provider will help you determine the amount of weight gain that is right for you.  Avoid raw meat and uncooked cheese. These carry germs that can cause birth defects in the baby.  Eating four or five small meals rather than three large meals a day may help relieve nausea and vomiting. If you start to feel nauseous, eating a few soda crackers can be helpful. Drinking liquids between meals, instead of during meals, also seems to help ease nausea and vomiting.  Limit foods that are high in fat and processed sugars, such as fried and sweet foods.  To prevent constipation: ? Eat foods that are high in fiber, such as fresh fruits and vegetables, whole grains, and beans. ? Drink enough fluid to keep your urine clear or pale yellow. Activity  Exercise only as directed by your health care provider. Most women can continue their usual exercise routine during  pregnancy. Try to exercise for 30 minutes at least 5 days a week. Exercising will help you: ? Control your weight. ? Stay in shape. ? Be prepared for labor and delivery.  Experiencing pain or cramping in the lower abdomen or lower back is a good sign that you should stop exercising. Check with your health care provider before continuing with normal exercises.  Try to avoid standing for long periods of time. Move your legs often if you must stand in one place for a long time.  Avoid heavy lifting.  Wear low-heeled shoes and practice good posture.  You may continue to have sex unless your health care provider tells you not to. Relieving pain and discomfort  Wear a good support bra to relieve breast tenderness.  Take warm sitz baths to soothe any pain or discomfort caused by hemorrhoids. Use hemorrhoid cream if your health care provider approves.  Rest with your legs elevated if you have leg cramps or low back pain.  If you develop   varicose veins in your legs, wear support hose. Elevate your feet for 15 minutes, 3-4 times a day. Limit salt in your diet. Prenatal care  Schedule your prenatal visits by the twelfth week of pregnancy. They are usually scheduled monthly at first, then more often in the last 2 months before delivery.  Write down your questions. Take them to your prenatal visits.  Keep all your prenatal visits as told by your health care provider. This is important. Safety  Wear your seat belt at all times when driving.  Make a list of emergency phone numbers, including numbers for family, friends, the hospital, and police and fire departments. General instructions  Ask your health care provider for a referral to a local prenatal education class. Begin classes no later than the beginning of month 6 of your pregnancy.  Ask for help if you have counseling or nutritional needs during pregnancy. Your health care provider can offer advice or refer you to specialists for help  with various needs.  Do not use hot tubs, steam rooms, or saunas.  Do not douche or use tampons or scented sanitary pads.  Do not cross your legs for long periods of time.  Avoid cat litter boxes and soil used by cats. These carry germs that can cause birth defects in the baby and possibly loss of the fetus by miscarriage or stillbirth.  Avoid all smoking, herbs, alcohol, and medicines not prescribed by your health care provider. Chemicals in these products affect the formation and growth of the baby.  Do not use any products that contain nicotine or tobacco, such as cigarettes and e-cigarettes. If you need help quitting, ask your health care provider. You may receive counseling support and other resources to help you quit.  Schedule a dentist appointment. At home, brush your teeth with a soft toothbrush and be gentle when you floss. Contact a health care provider if:  You have dizziness.  You have mild pelvic cramps, pelvic pressure, or nagging pain in the abdominal area.  You have persistent nausea, vomiting, or diarrhea.  You have a bad smelling vaginal discharge.  You have pain when you urinate.  You notice increased swelling in your face, hands, legs, or ankles.  You are exposed to fifth disease or chickenpox.  You are exposed to German measles (rubella) and have never had it. Get help right away if:  You have a fever.  You are leaking fluid from your vagina.  You have spotting or bleeding from your vagina.  You have severe abdominal cramping or pain.  You have rapid weight gain or loss.  You vomit blood or material that looks like coffee grounds.  You develop a severe headache.  You have shortness of breath.  You have any kind of trauma, such as from a fall or a car accident. Summary  The first trimester of pregnancy is from week 1 until the end of week 13 (months 1 through 3).  Your body goes through many changes during pregnancy. The changes vary from  woman to woman.  You will have routine prenatal visits. During those visits, your health care provider will examine you, discuss any test results you may have, and talk with you about how you are feeling. This information is not intended to replace advice given to you by your health care provider. Make sure you discuss any questions you have with your health care provider. Document Released: 07/07/2001 Document Revised: 06/24/2016 Document Reviewed: 06/24/2016 Elsevier Interactive Patient Education  2018 [employer]   Inc[employer]  

## 2018-01-03 LAB — GC/CHLAMYDIA PROBE AMP
Chlamydia trachomatis, NAA: NEGATIVE
Neisseria gonorrhoeae by PCR: NEGATIVE

## 2018-01-06 ENCOUNTER — Encounter: Payer: Medicaid Other | Admitting: Obstetrics and Gynecology

## 2018-01-06 ENCOUNTER — Other Ambulatory Visit: Payer: Medicaid Other

## 2018-01-12 ENCOUNTER — Encounter: Payer: Medicaid Other | Admitting: Advanced Practice Midwife

## 2018-01-12 ENCOUNTER — Other Ambulatory Visit: Payer: Medicaid Other

## 2018-01-12 DIAGNOSIS — O0991 Supervision of high risk pregnancy, unspecified, first trimester: Secondary | ICD-10-CM

## 2018-01-12 DIAGNOSIS — Z6831 Body mass index (BMI) 31.0-31.9, adult: Secondary | ICD-10-CM

## 2018-01-13 LAB — GLUCOSE, 1 HOUR GESTATIONAL: GESTATIONAL DIABETES SCREEN: 113 mg/dL (ref 65–139)

## 2018-01-17 LAB — HEMOGLOBINOPATHY EVALUATION
HGB A: 98 % (ref 96.4–98.8)
HGB C: 0 %
HGB S: 0 %
HGB VARIANT: 1 % — AB
Hemoglobin A2 Quantitation: 1 % — ABNORMAL LOW (ref 1.8–3.2)
Hemoglobin F Quantitation: 0 % (ref 0.0–2.0)

## 2018-01-17 LAB — RPR+RH+ABO+RUB AB+AB SCR+CB...
Antibody Screen: NEGATIVE
HIV Screen 4th Generation wRfx: NONREACTIVE
Hematocrit: 32.5 % — ABNORMAL LOW (ref 34.0–46.6)
Hemoglobin: 10.2 g/dL — ABNORMAL LOW (ref 11.1–15.9)
Hepatitis B Surface Ag: NEGATIVE
MCH: 27.1 pg (ref 26.6–33.0)
MCHC: 31.4 g/dL — AB (ref 31.5–35.7)
MCV: 86 fL (ref 79–97)
Platelets: 314 10*3/uL (ref 150–450)
RBC: 3.76 x10E6/uL — ABNORMAL LOW (ref 3.77–5.28)
RDW: 13.7 % (ref 12.3–15.4)
RPR Ser Ql: NONREACTIVE
Rh Factor: POSITIVE
Rubella Antibodies, IGG: 8.74 index (ref >0.99)
VARICELLA: 492 {index} (ref >165)
WBC: 8.5 10*3/uL (ref 3.4–10.8)

## 2018-01-17 LAB — THYROID PANEL WITH TSH
FREE THYROXINE INDEX: 1.9 (ref 1.2–4.9)
T3 UPTAKE RATIO: 17 % — AB (ref 24–39)
T4, Total: 11.2 ug/dL (ref 4.5–12.0)
TSH: 1.1 u[IU]/mL (ref 0.450–4.500)

## 2018-01-18 ENCOUNTER — Ambulatory Visit (INDEPENDENT_AMBULATORY_CARE_PROVIDER_SITE_OTHER): Payer: Medicaid Other | Admitting: Obstetrics and Gynecology

## 2018-01-18 ENCOUNTER — Ambulatory Visit (INDEPENDENT_AMBULATORY_CARE_PROVIDER_SITE_OTHER): Payer: Medicaid Other

## 2018-01-18 ENCOUNTER — Other Ambulatory Visit: Payer: Self-pay | Admitting: Maternal Newborn

## 2018-01-18 ENCOUNTER — Encounter: Payer: Self-pay | Admitting: Obstetrics and Gynecology

## 2018-01-18 VITALS — BP 114/64 | Wt 204.0 lb

## 2018-01-18 DIAGNOSIS — Z3689 Encounter for other specified antenatal screening: Secondary | ICD-10-CM

## 2018-01-18 DIAGNOSIS — F444 Conversion disorder with motor symptom or deficit: Secondary | ICD-10-CM | POA: Diagnosis not present

## 2018-01-18 DIAGNOSIS — O99281 Endocrine, nutritional and metabolic diseases complicating pregnancy, first trimester: Secondary | ICD-10-CM

## 2018-01-18 DIAGNOSIS — O99611 Diseases of the digestive system complicating pregnancy, first trimester: Secondary | ICD-10-CM | POA: Diagnosis not present

## 2018-01-18 DIAGNOSIS — F4310 Post-traumatic stress disorder, unspecified: Secondary | ICD-10-CM

## 2018-01-18 DIAGNOSIS — Z3A01 Less than 8 weeks gestation of pregnancy: Secondary | ICD-10-CM | POA: Diagnosis not present

## 2018-01-18 DIAGNOSIS — O3680X0 Pregnancy with inconclusive fetal viability, not applicable or unspecified: Secondary | ICD-10-CM | POA: Diagnosis not present

## 2018-01-18 DIAGNOSIS — O0991 Supervision of high risk pregnancy, unspecified, first trimester: Secondary | ICD-10-CM

## 2018-01-18 DIAGNOSIS — O99341 Other mental disorders complicating pregnancy, first trimester: Secondary | ICD-10-CM | POA: Diagnosis not present

## 2018-01-18 DIAGNOSIS — K219 Gastro-esophageal reflux disease without esophagitis: Secondary | ICD-10-CM | POA: Diagnosis not present

## 2018-01-18 DIAGNOSIS — Z3A10 10 weeks gestation of pregnancy: Secondary | ICD-10-CM

## 2018-01-18 DIAGNOSIS — E039 Hypothyroidism, unspecified: Secondary | ICD-10-CM | POA: Diagnosis not present

## 2018-01-18 MED ORDER — CONCEPT DHA 53.5-38-1 MG PO CAPS: 1.0000 | ORAL_CAPSULE | Freq: Every day | ORAL | 6 refills | Status: DC

## 2018-01-18 NOTE — Progress Notes (Signed)
  Routine Prenatal Care Visit  Subjective  Pamela Palmer is a 32 y.o. G2P1001 at [redacted]w[redacted]d being seen today for ongoing prenatal care.  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  ----------------------------------------------------------------------------------- Patient reports no complaints.    . Vag. Bleeding: None.   . Denies leaking of fluid.  U/S shows GA at [redacted]w[redacted]d, concern for missed ab.  Patient has no symptoms today.  ----------------------------------------------------------------------------------- The following portions of the patient's history were reviewed and updated as appropriate: allergies, current medications, past family history, past medical history, past social history, past surgical history and problem list. Problem list updated.   Objective  Blood pressure 114/64, weight 204 lb (92.5 kg), last menstrual period 11/07/2017, not currently breastfeeding. Pregravid weight 198 lb (89.8 kg) Total Weight Gain 6 lb (2.722 kg) Urinalysis:      Fetal Status: Fetal Heart Rate (bpm): absent         General:  Alert, oriented and cooperative. Patient is in no acute distress.  Skin: Skin is warm and dry. No rash noted.   Cardiovascular: Normal heart rate noted  Respiratory: Normal respiratory effort, no problems with respiration noted  Abdomen: Soft, gravid, appropriate for gestational age.       Pelvic:  Cervical exam deferred        Extremities: Normal range of motion.     Mental Status: Normal mood and affect. Normal behavior. Normal judgment and thought content.   Assessment   32 y.o. G2P1001  at [redacted]w[redacted]d by  08/14/2018, by Last Menstrual Period presenting for routine prenatal visit  Plan   SECOND Problems (from 12/31/17 to present)    No problems associated with this episode.       Preterm labor symptoms and general obstetric precautions including but not limited to vaginal bleeding, contractions, leaking of fluid and fetal movement were reviewed in detail with the patient. Please refer to After Visit Summary for other counseling recommendations.   Return in about 11 days (around 01/29/2018) for schedule u/s for dating/viability and ROB after.  Prentice Docker, MD, [facility], [facility] 01/18/2018 3:21 PM

## 2018-01-18 NOTE — Progress Notes (Incomplete)
  Routine Prenatal Care Visit  Subjective  Pamela Palmer is a 32 y.o. G2P1001 at [redacted]w[redacted]d being seen today for ongoing prenatal care.  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  ----------------------------------------------------------------------------------- Patient reports {sx:14538}.    . Vag. Bleeding: None.   . Denies leaking of fluid.  ----------------------------------------------------------------------------------- The following portions of the patient's history were reviewed and updated as appropriate: allergies, current medications, past family history, past medical history, past social history, past surgical history and problem list. Problem list updated.   Objective  Blood pressure 114/64, weight 204 lb (92.5 kg), last menstrual period 11/07/2017, not currently breastfeeding. Pregravid weight 198 lb (89.8 kg) Total Weight Gain 6 lb (2.722 kg) Urinalysis:      Fetal Status: Fetal Heart Rate (bpm): absent         General:  Alert, oriented and cooperative. Patient is in no acute distress.  Skin: Skin is warm and dry. No rash noted.   Cardiovascular: Normal heart rate noted  Respiratory: Normal respiratory effort, no problems with respiration noted  Abdomen: Soft, gravid, appropriate for gestational age.       Pelvic:  {Blank single:19197::"Cervical exam performed","Cervical exam deferred"}        Extremities: Normal range of motion.     Mental Status: Normal mood and affect. Normal behavior. Normal judgment and thought content.   Assessment   32 y.o.  G2P1001 at [redacted]w[redacted]d by  08/14/2018, by Last Menstrual Period presenting for {Blank single:19197::"routine","work-in"} prenatal visit  Plan   SECOND Problems (from 12/31/17 to present)    No problems associated with this episode.       {Blank single:19197::"Term","Preterm"} labor symptoms and general obstetric precautions including but not limited to vaginal bleeding, contractions, leaking of fluid and fetal movement were reviewed in detail with the patient. Please refer to After Visit Summary for other counseling recommendations.   Return in about 11 days (around 01/29/2018) for schedule u/s for dating/viability and ROB after.  Prentice Docker, MD, [facility], [facility] 01/18/2018 3:21 PM

## 2018-01-28 ENCOUNTER — Ambulatory Visit (INDEPENDENT_AMBULATORY_CARE_PROVIDER_SITE_OTHER): Payer: Medicaid Other

## 2018-01-28 ENCOUNTER — Ambulatory Visit (INDEPENDENT_AMBULATORY_CARE_PROVIDER_SITE_OTHER): Payer: Medicaid Other | Admitting: Advanced Practice Midwife

## 2018-01-28 ENCOUNTER — Other Ambulatory Visit: Payer: Self-pay | Admitting: Obstetrics and Gynecology

## 2018-01-28 ENCOUNTER — Other Ambulatory Visit: Payer: Self-pay | Admitting: Advanced Practice Midwife

## 2018-01-28 VITALS — BP 128/80 | Wt 205.0 lb

## 2018-01-28 DIAGNOSIS — Z3A01 Less than 8 weeks gestation of pregnancy: Secondary | ICD-10-CM | POA: Diagnosis not present

## 2018-01-28 DIAGNOSIS — N8312 Corpus luteum cyst of left ovary: Secondary | ICD-10-CM | POA: Diagnosis not present

## 2018-01-28 DIAGNOSIS — O021 Missed abortion: Secondary | ICD-10-CM | POA: Diagnosis not present

## 2018-01-28 DIAGNOSIS — O3481 Maternal care for other abnormalities of pelvic organs, first trimester: Secondary | ICD-10-CM

## 2018-01-28 NOTE — Progress Notes (Signed)
ROB U/S today

## 2018-01-28 NOTE — Progress Notes (Signed)
Patient is here today for follow up dating/viability scan. There is no change from the previous scan. Discussion of missed miscarriage with patient. Condolence given and treatment options reviewed. Patient questions answered. She would like to proceed with D&C and will schedule appointment with MD to discuss procedure.   ULTRASOUND REPORT  Location: [facility] Date of Service: 01/28/2018   Patient Name: ROHINI JAROSZEWSKI DOB: 01/25/1986 MRN: [id number]  Indications:dating Findings:  Nelda [place] intrauterine pregnancy is visualized with a CRL consistent with [redacted]w[redacted]d gestation, giving an (U/S) EDD of 09/22/2018. The (U/S) EDD is not consistent with the clinically established EDD of 08/14/2018.  FHR: Not discretely visualized. CRL measurement: 3.8 mm Yolk sac is not visualized and early anatomy is normal. Amnion: visualized and appears normal.   Right Ovary is normal in appearance. Left Ovary is normal appearance. Corpus luteal cyst:  is visualized on the left ovary. Survey of the adnexa demonstrates no adnexal masses. There is no free peritoneal fluid in the cul de sac.  Impression: 1. [redacted]w[redacted]d Singleton Intrauterine pregnancy by U/S with no discernable FHTs. 2. (U/S) EDD is not consistent with Clinically established EDD of 08/14/2018.  Recommendations: 1.Clinical correlation with the patient's History and Physical Exam.  Meyer Cory RDMS, RVT  Assessment/Plan  32 yo G14 P1 female with missed abortion  Schedule follow up with MD for [facility] Referral for procedure sent to Orlie Pollen, CNM

## 2018-01-28 NOTE — Patient Instructions (Signed)
Dilation and Curettage or Vacuum Curettage Dilation and curettage (D&C) and vacuum curettage are minor procedures. A D&C involves stretching (dilation) the cervix and scraping (curettage) the inside lining of the uterus (endometrium). During a D&C, tissue is gently scraped from the endometrium, starting from the top portion of the uterus down to the lowest part of the uterus (cervix). During a vacuum curettage, the lining and tissue in the uterus are removed with the use of gentle suction. Curettage may be performed to either diagnose or treat a problem. As a diagnostic procedure, curettage is performed to examine tissues from the uterus. A diagnostic curettage may be done if you have:  Irregular bleeding in the uterus.  Bleeding with the development of clots.  Spotting between menstrual periods.  Prolonged menstrual periods or other abnormal bleeding.  Bleeding after menopause.  No menstrual period (amenorrhea).  A change in size and shape of the uterus.  Abnormal endometrial cells discovered during a Pap test.  As a treatment procedure, curettage may be performed for the following reasons:  Removal of an IUD (intrauterine device).  Removal of retained placenta after giving birth.  Abortion.  Miscarriage.  Removal of endometrial polyps.  Removal of uncommon types of noncancerous lumps (fibroids).  Tell a health care provider about:  Any allergies you have, including allergies to prescribed medicine or latex.  All medicines you are taking, including vitamins, herbs, eye drops, creams, and over-the-counter medicines. This is especially important if you take any blood-thinning medicine. Bring a list of all of your medicines to your appointment.  Any problems you or family members have had with anesthetic medicines.  Any blood disorders you have.  Any surgeries you have had.  Your medical history and any medical conditions you have.  Whether you are pregnant or may be  pregnant.  Recent vaginal infections you have had.  Recent menstrual periods, bleeding problems you have had, and what form of birth control (contraception) you use. What are the risks? Generally, this is a safe procedure. However, problems may occur, including:  Infection.  Heavy vaginal bleeding.  Allergic reactions to medicines.  Damage to the cervix or other structures or organs.  Development of scar tissue (adhesions) inside the uterus, which can cause abnormal amounts of menstrual bleeding. This may make it harder to get pregnant in the future.  A hole (perforation) or puncture in the uterine wall. This is rare.  What happens before the procedure? Staying hydrated Follow instructions from your health care provider about hydration, which may include:  Up to 2 hours before the procedure - you may continue to drink clear liquids, such as water, clear fruit juice, black coffee, and plain tea.  Eating and drinking restrictions Follow instructions from your health care provider about eating and drinking, which may include:  8 hours before the procedure - stop eating heavy meals or foods such as meat, fried foods, or fatty foods.  6 hours before the procedure - stop eating light meals or foods, such as toast or cereal.  6 hours before the procedure - stop drinking milk or drinks that contain milk.  2 hours before the procedure - stop drinking clear liquids. If your health care provider told you to take your medicine(s) on the day of your procedure, take them with only a sip of water.  Medicines  Ask your health care provider about: ? Changing or stopping your regular medicines. This is especially important if you are taking diabetes medicines or blood thinners. ? Taking   medicines such as aspirin and ibuprofen. These medicines can thin your blood. Do not take these medicines before your procedure if your health care provider instructs you not to.  You may be given antibiotic  medicine to help prevent infection. General instructions  For 24 hours before your procedure, do not: ? Douche. ? Use tampons. ? Use medicines, creams, or suppositories in the vagina. ? Have sexual intercourse.  You may be given a pregnancy test on the day of the procedure.  Plan to have someone take you home from the hospital or clinic.  You may have a blood or urine sample taken.  If you will be going home right after the procedure, plan to have someone with you for 24 hours. What happens during the procedure?  To reduce your risk of infection: ? Your health care team will wash or sanitize their hands. ? Your skin will be washed with soap.  An IV tube will be inserted into one of your veins.  You will be given one of the following: ? A medicine that numbs the area in and around the cervix (local anesthetic). ? A medicine to make you fall asleep (general anesthetic).  You will lie down on your back, with your feet in foot rests (stirrups).  The size and position of your uterus will be checked.  A lubricated instrument (speculum or Sims retractor) will be inserted into the back side of your vagina. The speculum will be used to hold apart the walls of your vagina so your health care provider can see your cervix.  A tool (tenaculum) will be attached to the lip of the cervix to stabilize it.  Your cervix will be softened and dilated. This may be done by: ? Taking a medicine. ? Having tapered dilators or thin rods (laminaria) or gradual widening instruments (tapered dilators) inserted into your cervix.  A small, sharp, curved instrument (curette) will be used to scrape a small amount of tissue or cells from the endometrium or cervical canal. In some cases, gentle suction is applied with the curette. The curette will then be removed. The cells will be taken to a lab for testing. The procedure may vary among health care providers and hospitals. What happens after the  procedure?  You may have mild cramping, backache, pain, and light bleeding or spotting. You may pass small blood clots from your vagina.  You may have to wear compression stockings. These stockings help to prevent blood clots and reduce swelling in your legs.  Your blood pressure, heart rate, breathing rate, and blood oxygen level will be monitored until the medicines you were given have worn off. Summary  Dilation and curettage (D&C) involves stretching (dilation) the cervix and scraping (curettage) the inside lining of the uterus (endometrium).  After the procedure, you may have mild cramping, backache, pain, and light bleeding or spotting. You may pass small blood clots from your vagina.  Plan to have someone take you home from the hospital or clinic. This information is not intended to replace advice given to you by your health care provider. Make sure you discuss any questions you have with your health care provider. Document Released: 07/13/2005 Document Revised: 03/29/2016 Document Reviewed: 03/29/2016 Elsevier Interactive Patient Education  2018 [employer] A miscarriage is the sudden loss of an unborn baby (fetus) before the 20th week of pregnancy. Most miscarriages happen in the first 3 months of pregnancy. Sometimes, it happens before a woman even knows she is pregnant. A miscarriage  is also called a "spontaneous miscarriage" or "early pregnancy loss." Having a miscarriage can be an emotional experience. Talk with your caregiver about any questions you may have about miscarrying, the grieving process, and your future pregnancy plans. What are the causes?  Problems with the fetal chromosomes that make it impossible for the baby to develop normally. Problems with the baby's genes or chromosomes are most often the result of errors that occur, by chance, as the embryo divides and grows. The problems are not inherited from the parents.  Infection of the cervix or  uterus.  Hormone problems.  Problems with the cervix, such as having an incompetent cervix. This is when the tissue in the cervix is not strong enough to hold the pregnancy.  Problems with the uterus, such as an abnormally shaped uterus, uterine fibroids, or congenital abnormalities.  Certain medical conditions.  Smoking, drinking alcohol, or taking illegal drugs.  Trauma. Often, the cause of a miscarriage is unknown. What are the signs or symptoms?  Vaginal bleeding or spotting, with or without cramps or pain.  Pain or cramping in the abdomen or lower back.  Passing fluid, tissue, or blood clots from the vagina. How is this diagnosed? Your caregiver will perform a physical exam. You may also have an ultrasound to confirm the miscarriage. Blood or urine tests may also be ordered. How is this treated?  Sometimes, treatment is not necessary if you naturally pass all the fetal tissue that was in the uterus. If some of the fetus or placenta remains in the body (incomplete miscarriage), tissue left behind may become infected and must be removed. Usually, a dilation and curettage (D and C) procedure is performed. During a D and C procedure, the cervix is widened (dilated) and any remaining fetal or placental tissue is gently removed from the uterus.  Antibiotic medicines are prescribed if there is an infection. Other medicines may be given to reduce the size of the uterus (contract) if there is a lot of bleeding.  If you have Rh negative blood and your baby was Rh positive, you will need a Rh immunoglobulin shot. This shot will protect any future baby from having Rh blood problems in future pregnancies. Follow these instructions at home:  Your caregiver may order bed rest or may allow you to continue light activity. Resume activity as directed by your caregiver.  Have someone help with home and family responsibilities during this time.  Keep track of the number of sanitary pads you use  each day and how soaked (saturated) they are. Write down this information.  Do not use tampons. Do not douche or have sexual intercourse until approved by your caregiver.  Only take over-the-counter or prescription medicines for pain or discomfort as directed by your caregiver.  Do not take aspirin. Aspirin can cause bleeding.  Keep all follow-up appointments with your caregiver.  If you or your partner have problems with grieving, talk to your caregiver or seek counseling to help cope with the pregnancy loss. Allow enough time to grieve before trying to get pregnant again. Get help right away if:  You have severe cramps or pain in your back or abdomen.  You have a fever.  You pass large blood clots (walnut-sized or larger) ortissue from your vagina. Save any tissue for your caregiver to inspect.  Your bleeding increases.  You have a thick, bad-smelling vaginal discharge.  You become lightheaded, weak, or you faint.  You have chills. This information is not intended to replace  advice given to you by your health care provider. Make sure you discuss any questions you have with your health care provider. Document Released: 01/06/2001 Document Revised: 12/19/2015 Document Reviewed: 09/01/2011 Elsevier Interactive Patient Education  2017 [employer].

## 2018-01-31 ENCOUNTER — Telehealth: Payer: Self-pay | Admitting: Obstetrics and Gynecology

## 2018-01-31 NOTE — Telephone Encounter (Signed)
Lmtrc

## 2018-01-31 NOTE — Telephone Encounter (Signed)
Patient is aware of Pre-admit Testing phone interview on 02/01/18 9am-1pm, and OR on 02/03/18. Patient is aware she may receive calls from the [facility].  Patient would like to continue her prenatal vitamins and wants to know if this is okay.

## 2018-01-31 NOTE — Telephone Encounter (Signed)
-----   Message from Rod Can, CNM sent at 01/28/2018  1:52 PM EDT ----- Regarding: D&C Surgery Booking Request Patient Full Name:  Pamela Palmer  MRN: [id number]  DOB: November 01, 1985  Surgeon: ? Requested Surgery Date and Time:  Primary Diagnosis AND Code: missed miscarriage Secondary Diagnosis and Code:  Surgical Procedure: D&C L&D Notification: NA Admission Status: same day surgery Length of Surgery: ? Special Case Needs:  H&P:  (date) Phone Interview???:  Interpreter: Language:  Medical Clearance: Special Scheduling Instructions:   I'm not really sure how to get this done- does she talk with doc first and then schedule procedure? Do you call OR and pick a date/time/doc?

## 2018-01-31 NOTE — Telephone Encounter (Signed)
Returned patient's call about prenatal vitamins.  No answer.

## 2018-02-01 ENCOUNTER — Other Ambulatory Visit: Payer: Self-pay

## 2018-02-01 ENCOUNTER — Encounter
Admission: RE | Admit: 2018-02-01 | Discharge: 2018-02-01 | Disposition: A | Discharge status: Home / Self Care | Payer: Medicaid Other | Source: Ambulatory Visit | Attending: Obstetrics and Gynecology | Admitting: Obstetrics and Gynecology

## 2018-02-01 HISTORY — DX: Anemia, unspecified: D64.9

## 2018-02-01 NOTE — Patient Instructions (Signed)
Your procedure is scheduled on: 02-03-18 THURSDAY Report to Same Day Surgery 2nd floor medical mall [facility] Entrance-take elevator on left to 2nd floor.  Check in with surgery information desk.) To find out your arrival time please call [phone] between 1PM - 3PM on 02-02-18 [facility]  Remember: Instructions that are not followed completely may result in serious medical risk, up to and including death, or upon the discretion of your surgeon and anesthesiologist your surgery may need to be rescheduled.    _x___ 1. Do not eat food after midnight the night before your procedure. NO GUM OR CANDY AFTER MIDNIGHT.  You may drink clear liquids up to 2 hours before you are scheduled to arrive at the hospital for your procedure.  Do not drink clear liquids within 2 hours of your scheduled arrival to the hospital.  Clear liquids include  --Water or Apple juice without pulp  --Clear carbohydrate beverage such as ClearFast or Gatorade  --Black Coffee or Clear Tea (No milk, no creamers, do not add anything to the coffee or Tea    __x__ 2. No Alcohol for 24 hours before or after surgery.   __x__3. No Smoking or e-cigarettes for 24 prior to surgery.  Do not use any chewable tobacco products for at least 6 hour prior to surgery   ____  4. Bring all medications with you on the day of surgery if instructed.    __x__ 5. Notify your doctor if there is any change in your medical condition     (cold, fever, infections).    x___6. On the morning of surgery brush your teeth with toothpaste and water.  You may rinse your mouth with mouth wash if you wish.  Do not swallow any toothpaste or mouthwash.   Do not wear jewelry, make-up, hairpins, clips or nail polish.  Do not wear lotions, powders, or perfumes. You may wear deodorant.  Do not shave 48 hours prior to surgery. Men may shave face and neck.  Do not bring valuables to the hospital.    [facility] is not responsible for any belongings or  valuables.               Contacts, dentures or bridgework may not be worn into surgery.  Leave your suitcase in the car. After surgery it may be brought to your room.  For patients admitted to the hospital, discharge time is determined by your treatment team.  _  Patients discharged the day of surgery will not be allowed to drive home.  You will need someone to drive you home and stay with you the night of your procedure.    Please read over the following fact sheets that you were given:   [facility] Preparing for Surgery  _x___ TAKE THE FOLLOWING MEDICATION THE MORNING OF SURGERY WITH A SMALL SIP OF WATER. These include:  1. LEVOTHYROXINE  2. PRILOSEC  3. TAKE AN EXTRA PRILOSEC THE NIGHT BEFORE YOUR SURGERY  4.  5.  6.  ____Fleets enema or Magnesium Citrate as directed.   ____ Use CHG Soap or sage wipes as directed on instruction sheet   _X___ Use inhalers on the day of surgery and bring to hospital day of surgery-USE YOUR SYMBICORT AND ALBUTEROL INHALER DAY OF SURGERY AND BRING ALBUTEROL [place]  ____ Stop Metformin and Janumet 2 days prior to surgery.    ____ Take 1/2 of usual insulin dose the night before surgery and none on the morning  surgery.  ____ Follow recommendations from Cardiologist, Pulmonologist or PCP regarding stopping Aspirin, Coumadin, Plavix ,Eliquis, Effient, or Pradaxa, and Pletal.  X____Stop Anti-inflammatories such as Advil, Aleve, Ibuprofen, Motrin, Naproxen, Naprosyn, Goodies powders or aspirin products NOW-OK to take Tylenol    ____ Stop supplements until after surgery.     ____ Bring C-Pap to the hospital.

## 2018-02-02 ENCOUNTER — Encounter
Admission: RE | Admit: 2018-02-02 | Discharge: 2018-02-02 | Disposition: A | Discharge status: Home / Self Care | Payer: Medicaid Other | Source: Ambulatory Visit | Attending: Obstetrics and Gynecology | Admitting: Obstetrics and Gynecology

## 2018-02-02 ENCOUNTER — Other Ambulatory Visit: Payer: Self-pay | Admitting: Obstetrics and Gynecology

## 2018-02-02 DIAGNOSIS — Z01812 Encounter for preprocedural laboratory examination: Secondary | ICD-10-CM | POA: Insufficient documentation

## 2018-02-02 DIAGNOSIS — E876 Hypokalemia: Secondary | ICD-10-CM

## 2018-02-02 LAB — COMPREHENSIVE METABOLIC PANEL
ALK PHOS: 88 U/L (ref 38–126)
ALT: 21 U/L (ref 0–44)
AST: 18 U/L (ref 15–41)
Albumin: 3.4 g/dL — ABNORMAL LOW (ref 3.5–5.0)
Anion gap: 6 (ref 5–15)
BILIRUBIN TOTAL: 0.4 mg/dL (ref 0.3–1.2)
BUN: 10 mg/dL (ref 6–20)
CO2: 24 mmol/L (ref 22–32)
CREATININE: 0.64 mg/dL (ref 0.44–1.00)
Calcium: 8.7 mg/dL — ABNORMAL LOW (ref 8.9–10.3)
Chloride: 105 mmol/L (ref 98–111)
GFR calc Af Amer: >60 mL/min (ref >60)
Glucose, Bld: 103 mg/dL — ABNORMAL HIGH (ref 70–99)
Potassium: 3.1 mmol/L — ABNORMAL LOW (ref 3.5–5.1)
Sodium: 135 mmol/L (ref 135–145)
TOTAL PROTEIN: 6.7 g/dL (ref 6.5–8.1)

## 2018-02-02 LAB — CBC
HEMATOCRIT: 32 % — AB (ref 35.0–47.0)
HEMOGLOBIN: 10.8 g/dL — AB (ref 12.0–16.0)
MCH: 28.6 pg (ref 26.0–34.0)
MCHC: 33.7 g/dL (ref 32.0–36.0)
MCV: 84.9 fL (ref 80.0–100.0)
Platelets: 274 10*3/uL (ref 150–440)
RBC: 3.78 MIL/uL — AB (ref 3.80–5.20)
RDW: 13.8 % (ref 11.5–14.5)
WBC: 6.6 10*3/uL (ref 3.6–11.0)

## 2018-02-02 LAB — TYPE AND SCREEN
ABO/RH(D): B POS
Antibody Screen: NEGATIVE

## 2018-02-02 MED ORDER — POTASSIUM CHLORIDE ER 10 MEQ PO TBCR: 20.0000 meq | EXTENDED_RELEASE_TABLET | Freq: Three times a day (TID) | ORAL | 0 refills | Status: DC

## 2018-02-02 NOTE — Telephone Encounter (Signed)
Spoke to patient. Requesting to continue pre-natal vitamins.

## 2018-02-03 ENCOUNTER — Ambulatory Visit
Admission: RE | Admit: 2018-02-03 | Discharge: 2018-02-03 | Disposition: A | Discharge status: Home / Self Care | Payer: Medicaid Other | Source: Ambulatory Visit | Attending: Obstetrics and Gynecology | Admitting: Obstetrics and Gynecology

## 2018-02-03 ENCOUNTER — Encounter
Admission: RE | Disposition: A | Discharge status: Home / Self Care | Payer: Self-pay | Source: Ambulatory Visit | Attending: Obstetrics and Gynecology

## 2018-02-03 ENCOUNTER — Encounter: Payer: Medicaid Other | Admitting: Obstetrics & Gynecology

## 2018-02-03 ENCOUNTER — Ambulatory Visit: Payer: Medicaid Other | Admitting: Anesthesiology

## 2018-02-03 ENCOUNTER — Encounter: Payer: Self-pay | Admitting: *Deleted

## 2018-02-03 DIAGNOSIS — O021 Missed abortion: Secondary | ICD-10-CM | POA: Diagnosis not present

## 2018-02-03 DIAGNOSIS — G4730 Sleep apnea, unspecified: Secondary | ICD-10-CM | POA: Insufficient documentation

## 2018-02-03 DIAGNOSIS — G809 Cerebral palsy, unspecified: Secondary | ICD-10-CM | POA: Insufficient documentation

## 2018-02-03 DIAGNOSIS — F419 Anxiety disorder, unspecified: Secondary | ICD-10-CM | POA: Insufficient documentation

## 2018-02-03 DIAGNOSIS — K219 Gastro-esophageal reflux disease without esophagitis: Secondary | ICD-10-CM | POA: Insufficient documentation

## 2018-02-03 DIAGNOSIS — Z79899 Other long term (current) drug therapy: Secondary | ICD-10-CM | POA: Diagnosis not present

## 2018-02-03 DIAGNOSIS — J45909 Unspecified asthma, uncomplicated: Secondary | ICD-10-CM | POA: Diagnosis not present

## 2018-02-03 DIAGNOSIS — M47816 Spondylosis without myelopathy or radiculopathy, lumbar region: Secondary | ICD-10-CM | POA: Insufficient documentation

## 2018-02-03 DIAGNOSIS — Z96642 Presence of left artificial hip joint: Secondary | ICD-10-CM | POA: Insufficient documentation

## 2018-02-03 DIAGNOSIS — E039 Hypothyroidism, unspecified: Secondary | ICD-10-CM | POA: Insufficient documentation

## 2018-02-03 HISTORY — PX: DILATION AND EVACUATION: SHX1459

## 2018-02-03 LAB — POCT I-STAT 4, (NA,K, GLUC, HGB,HCT)
GLUCOSE: 87 mg/dL (ref 70–99)
HCT: 33 % — ABNORMAL LOW (ref 36.0–46.0)
HEMOGLOBIN: 11.2 g/dL — AB (ref 12.0–15.0)
POTASSIUM: 3.4 mmol/L — AB (ref 3.5–5.1)
Sodium: 138 mmol/L (ref 135–145)

## 2018-02-03 SURGERY — DILATION AND EVACUATION, UTERUS
Anesthesia: General | Wound class: Clean Contaminated

## 2018-02-03 MED ORDER — IPRATROPIUM-ALBUTEROL 0.5-2.5 (3) MG/3ML IN SOLN
3.0000 mL | Freq: Once | RESPIRATORY_TRACT | Status: AC
  Administered 2018-02-03: 3 mL via RESPIRATORY_TRACT

## 2018-02-03 MED ORDER — LIDOCAINE HCL (CARDIAC) PF 100 MG/5ML IV SOSY
PREFILLED_SYRINGE | INTRAVENOUS | Status: DC | PRN
  Administered 2018-02-03: 60 mg via INTRAVENOUS

## 2018-02-03 MED ORDER — IPRATROPIUM-ALBUTEROL 0.5-2.5 (3) MG/3ML IN SOLN
RESPIRATORY_TRACT | Status: AC
  Administered 2018-02-03: 3 mL via RESPIRATORY_TRACT
  Filled 2018-02-03: qty 3

## 2018-02-03 MED ORDER — LACTATED RINGERS IV SOLN
INTRAVENOUS | Status: DC
  Administered 2018-02-03: 14:00:00 via INTRAVENOUS

## 2018-02-03 MED ORDER — PROPOFOL 10 MG/ML IV BOLUS
INTRAVENOUS | Status: AC
  Filled 2018-02-03: qty 20

## 2018-02-03 MED ORDER — FENTANYL CITRATE (PF) 100 MCG/2ML IJ SOLN
25.0000 ug | INTRAMUSCULAR | Status: DC | PRN
Start: 2018-02-03 — End: 2018-02-03

## 2018-02-03 MED ORDER — KETOROLAC TROMETHAMINE 30 MG/ML IJ SOLN
INTRAMUSCULAR | Status: DC | PRN
  Administered 2018-02-03: 30 mg via INTRAVENOUS

## 2018-02-03 MED ORDER — PROPOFOL 10 MG/ML IV BOLUS
INTRAVENOUS | Status: DC | PRN
  Administered 2018-02-03: 150 mg via INTRAVENOUS

## 2018-02-03 MED ORDER — ONDANSETRON HCL 4 MG/2ML IJ SOLN
INTRAMUSCULAR | Status: AC
  Filled 2018-02-03: qty 2

## 2018-02-03 MED ORDER — SILVER NITRATE-POT NITRATE 75-25 % EX MISC
CUTANEOUS | Status: AC
  Filled 2018-02-03: qty 5

## 2018-02-03 MED ORDER — SILVER NITRATE-POT NITRATE 75-25 % EX MISC
CUTANEOUS | Status: DC | PRN
  Administered 2018-02-03: 4

## 2018-02-03 MED ORDER — ONDANSETRON HCL 4 MG/2ML IJ SOLN
INTRAMUSCULAR | Status: DC | PRN
  Administered 2018-02-03: 4 mg via INTRAVENOUS

## 2018-02-03 MED ORDER — FENTANYL CITRATE (PF) 100 MCG/2ML IJ SOLN
INTRAMUSCULAR | Status: AC
  Filled 2018-02-03: qty 2

## 2018-02-03 MED ORDER — SILVER NITRATE-POT NITRATE 75-25 % EX MISC
CUTANEOUS | Status: AC
  Filled 2018-02-03: qty 4

## 2018-02-03 MED ORDER — IBUPROFEN 600 MG PO TABS: 600.0000 mg | ORAL_TABLET | Freq: Four times a day (QID) | ORAL | 0 refills | Status: AC | PRN

## 2018-02-03 MED ORDER — SUGAMMADEX SODIUM 200 MG/2ML IV SOLN
INTRAVENOUS | Status: DC | PRN
  Administered 2018-02-03: 180 mg via INTRAVENOUS

## 2018-02-03 MED ORDER — MIDAZOLAM HCL 5 MG/5ML IJ SOLN
INTRAMUSCULAR | Status: DC | PRN
  Administered 2018-02-03 (×2): 1 mg via INTRAVENOUS

## 2018-02-03 MED ORDER — LACTATED RINGERS IV SOLN: INTRAVENOUS | Status: DC

## 2018-02-03 MED ORDER — DOXYCYCLINE HYCLATE 100 MG IV SOLR
200.0000 mg | INTRAVENOUS | Status: AC
  Administered 2018-02-03: 200 mg via INTRAVENOUS
  Filled 2018-02-03: qty 200

## 2018-02-03 MED ORDER — ONDANSETRON HCL 4 MG/2ML IJ SOLN: 4.0000 mg | Freq: Once | INTRAMUSCULAR | Status: DC | PRN

## 2018-02-03 MED ORDER — ROCURONIUM BROMIDE 100 MG/10ML IV SOLN
INTRAVENOUS | Status: DC | PRN
  Administered 2018-02-03: 10 mg via INTRAVENOUS
  Administered 2018-02-03: 5 mg via INTRAVENOUS

## 2018-02-03 MED ORDER — FENTANYL CITRATE (PF) 100 MCG/2ML IJ SOLN
INTRAMUSCULAR | Status: DC | PRN
  Administered 2018-02-03 (×2): 50 ug via INTRAVENOUS

## 2018-02-03 MED ORDER — DEXAMETHASONE SODIUM PHOSPHATE 10 MG/ML IJ SOLN
INTRAMUSCULAR | Status: AC
  Filled 2018-02-03: qty 1

## 2018-02-03 MED ORDER — MIDAZOLAM HCL 2 MG/2ML IJ SOLN
INTRAMUSCULAR | Status: AC
  Filled 2018-02-03: qty 2

## 2018-02-03 MED ORDER — DEXAMETHASONE SODIUM PHOSPHATE 10 MG/ML IJ SOLN
INTRAMUSCULAR | Status: DC | PRN
  Administered 2018-02-03: 5 mg via INTRAVENOUS

## 2018-02-03 SURGICAL SUPPLY — 22 items
BAG URINE DRAINAGE (UROLOGICAL SUPPLIES) IMPLANT
CATH FOLEY 2WAY  5CC 16FR (CATHETERS)
CATH ROBINSON RED A/P 16FR (CATHETERS) ×3 IMPLANT
CATH URTH 16FR FL 2W BLN LF (CATHETERS) IMPLANT
FILTER UTR ASPR SPEC (MISCELLANEOUS) ×1 IMPLANT
FLTR UTR ASPR SPEC (MISCELLANEOUS) ×3
GLOVE BIO SURGEON STRL SZ7 (GLOVE) ×3 IMPLANT
GOWN STRL REUS W/ TWL LRG LVL3 (GOWN DISPOSABLE) ×2 IMPLANT
GOWN STRL REUS W/TWL LRG LVL3 (GOWN DISPOSABLE) ×4
KIT BERKELEY 1ST TRIMESTER 3/8 (MISCELLANEOUS) ×3 IMPLANT
KIT TURNOVER CYSTO (KITS) ×3 IMPLANT
NS IRRIG 500ML POUR BTL (IV SOLUTION) ×3 IMPLANT
PACK DNC HYST (MISCELLANEOUS) ×3 IMPLANT
PAD OB MATERNITY 4.3X12.25 (PERSONAL CARE ITEMS) ×3 IMPLANT
PAD PREP 24X41 OB/GYN DISP (PERSONAL CARE ITEMS) IMPLANT
SET BERKELEY SUCTION TUBING (SUCTIONS) ×3 IMPLANT
TOWEL OR 17X26 4PK STRL BLUE (TOWEL DISPOSABLE) ×3 IMPLANT
VACURETTE 10 RIGID CVD (CANNULA) IMPLANT
VACURETTE 12 RIGID CVD (CANNULA) IMPLANT
VACURETTE 7MM F TIP (CANNULA) ×2
VACURETTE 7MM F TIP STRL (CANNULA) ×1 IMPLANT
VACURETTE 8 RIGID CVD (CANNULA) IMPLANT

## 2018-02-03 NOTE — Op Note (Signed)
  Operative Note    Pre-Op Diagnosis: missed abortion  Post-Op Diagnosis: missed abortion  Procedures:  Suction, dilation and curettage   Primary Surgeon: Prentice Docker, MD   EBL: 150 mL   IVF: 800 mL crystalloid  Urine output: not measured  Specimens: endometrial curettings  Drains: none  Complications: None   Disposition: PACU   Condition: Stable   Findings: 7 week size uterus  Procedure Summary:   After informed consent was confirmed, the patient was taken to the operating room where general anesthesia was induced.  Her legs were carefully placed in the [place].  She was prepped and draped in the standard fashion.  A speculum was placed in the vagina and the cervix was prepped with betadine.   A tenaculum was placed on the anterior lip of the cervix.  The cervix was serially dilated to 8 mm.  The 7 mm flexalble suction curette was advanced to the uterine fundus.  Two passes were made with the suction curette with evacuation of adequate tissue noted.  Two passes were made using sharp curettage until a gritty texture was noted.  One final pass was made with the suction curette.  All instruments were removed from the vagina and the cervix was noted to be hemostatic after removal of the tenaculum with the use of silver nitrate.  At the end of the procedure, sponge, lap, and needle counts were correct x 2.    The patient tolerated the procedure well.  Sponge, lap, needle, and instrument counts were correct x 2.  VTE prophylaxis: SCDs. Antibiotic prophylaxis: doxycycline 200 mg IV on call to OR. She was awakened in the operating room and was taken to the PACU in stable condition.   Prentice Docker, MD 02/03/2018 3:21 PM

## 2018-02-03 NOTE — Transfer of Care (Signed)
Immediate Anesthesia Transfer of Care Note  Patient: Pamela Palmer  Procedure(s) Performed: DILATATION AND EVACUATION (N/A )  Patient Location: PACU  Anesthesia Type:General  Level of Consciousness: sedated  Airway & Oxygen Therapy: Patient Spontanous Breathing and Patient connected to face mask oxygen  Post-op Assessment: Report given to RN and Post -op Vital signs reviewed and stable  Post vital signs: Reviewed and stable  Last Vitals:  Vitals Value Taken Time  BP 117/86 02/03/2018  3:37 PM  Temp 36.8 C 02/03/2018  3:37 PM  Pulse 108 02/03/2018  3:40 PM  Resp 31 02/03/2018  3:40 PM  SpO2 100 % 02/03/2018  3:40 PM  Vitals shown include unvalidated device data.  Last Pain:  Vitals:   02/03/18 1305  TempSrc: Oral         Complications: No apparent anesthesia complications

## 2018-02-03 NOTE — Anesthesia Preprocedure Evaluation (Addendum)
Anesthesia Evaluation  Patient identified by MRN, date of birth, ID band Patient awake    Reviewed: Allergy & Precautions, NPO status , Patient's Chart, lab work & pertinent test results  History of Anesthesia Complications Negative for: history of anesthetic complications  Airway Mallampati: III       Dental   Pulmonary asthma , sleep apnea (not using CPAP) , neg COPD,           Cardiovascular (-) hypertension(-) Past MI and (-) CHF (-) pacemaker(-) Valvular Problems/Murmurs     Neuro/Psych neg Seizures Anxiety CP    GI/Hepatic Neg liver ROS, GERD  Medicated,  Endo/Other  neg diabetesHypothyroidism   Renal/GU negative Renal ROS     Musculoskeletal   Abdominal   Peds  Hematology   Anesthesia Other Findings   Reproductive/Obstetrics                            Anesthesia Physical Anesthesia Plan  ASA: III  Anesthesia Plan: General   Post-op Pain Management:    Induction:   PONV Risk Score and Plan: Dexamethasone, Ondansetron and Midazolam  Airway Management Planned: Oral ETT  Additional Equipment:   Intra-op Plan:   Post-operative Plan:   Informed Consent: I have reviewed the patients History and Physical, chart, labs and discussed the procedure including the risks, benefits and alternatives for the proposed anesthesia with the patient or authorized representative who has indicated his/her understanding and acceptance.     Plan Discussed with:   Anesthesia Plan Comments:         Anesthesia Quick Evaluation

## 2018-02-03 NOTE — Discharge Instructions (Signed)
AMBULATORY SURGERY  °DISCHARGE INSTRUCTIONS ° ° °1) The drugs that you were given will stay in your system until tomorrow so for the next 24 hours you should not: ° °A) Drive an automobile °B) Make any legal decisions °C) Drink any alcoholic beverage ° ° °2) You may resume regular meals tomorrow.  Today it is better to start with liquids and gradually work up to solid foods. ° °You may eat anything you prefer, but it is better to start with liquids, then soup and crackers, and gradually work up to solid foods. ° ° °3) Please notify your doctor immediately if you have any unusual bleeding, trouble breathing, redness and pain at the surgery site, drainage, fever, or pain not relieved by medication. ° ° ° °4) Additional Instructions: ° ° ° ° ° ° ° °Please contact your physician with any problems or Same Day Surgery at [phone], Monday through Friday 6 am to 4 pm, or [place] [facility] [place] Main number at [phone]. °

## 2018-02-03 NOTE — Anesthesia Post-op Follow-up Note (Signed)
Anesthesia QCDR form completed.        

## 2018-02-03 NOTE — OR Nursing (Signed)
Discharge instructions discussed with pt and husband. Both voice understanding. 
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
## 2018-02-03 NOTE — Anesthesia Procedure Notes (Signed)
Procedure Name: Intubation Date/Time: 02/03/2018 2:42 PM Performed by: Dionne Bucy, CRNA Pre-anesthesia Checklist: Patient identified, Patient being monitored, Timeout performed, Emergency Drugs available and Suction available Patient Re-evaluated:Patient Re-evaluated prior to induction Oxygen Delivery Method: Circle system utilized Preoxygenation: Pre-oxygenation with 100% oxygen Induction Type: IV induction, Cricoid Pressure applied and Rapid sequence Ventilation: Mask ventilation without difficulty Laryngoscope Size: Mac and 3 Grade View: Grade II Tube type: Oral Tube size: 7.0 mm Number of attempts: 1 Airway Equipment and Method: Stylet Placement Confirmation: ETT inserted through vocal cords under direct vision,  positive ETCO2 and breath sounds checked- equal and bilateral Secured at: 19 cm Tube secured with: Tape Dental Injury: Teeth and Oropharynx as per pre-operative assessment

## 2018-02-03 NOTE — Anesthesia Postprocedure Evaluation (Signed)
Anesthesia Post Note  Patient: Pamela Palmer  Procedure(s) Performed: DILATATION AND EVACUATION (N/A )  Patient location during evaluation: PACU Anesthesia Type: General Level of consciousness: sedated Pain management: pain level controlled Vital Signs Assessment: post-procedure vital signs reviewed and stable Respiratory status: spontaneous breathing and respiratory function stable Cardiovascular status: stable Anesthetic complications: no     Last Vitals:  Vitals:   02/03/18 1305 02/03/18 1537  BP: 128/85 117/86  Pulse: 91 (!) 112  Resp: 18 16  Temp: 36.6 C 36.8 C  SpO2: 100% 100%    Last Pain:  Vitals:   02/03/18 1305  TempSrc: Oral                 Hanae Waiters K

## 2018-02-04 ENCOUNTER — Encounter: Payer: Self-pay | Admitting: Obstetrics and Gynecology

## 2018-02-07 LAB — SURGICAL PATHOLOGY

## 2018-02-17 ENCOUNTER — Ambulatory Visit: Payer: Medicaid Other | Admitting: Obstetrics and Gynecology

## 2018-02-18 ENCOUNTER — Ambulatory Visit (INDEPENDENT_AMBULATORY_CARE_PROVIDER_SITE_OTHER): Payer: Medicaid Other | Admitting: Obstetrics and Gynecology

## 2018-02-18 ENCOUNTER — Encounter: Payer: Self-pay | Admitting: Obstetrics and Gynecology

## 2018-02-18 VITALS — BP 124/82 | HR 89 | Ht 67.0 in | Wt 201.0 lb

## 2018-02-18 DIAGNOSIS — Z09 Encounter for follow-up examination after completed treatment for conditions other than malignant neoplasm: Secondary | ICD-10-CM

## 2018-02-18 DIAGNOSIS — O021 Missed abortion: Secondary | ICD-10-CM

## 2018-02-18 NOTE — Progress Notes (Signed)
   Postoperative Follow-up Patient presents post op from suction, dilation, and curettage 2 weeks ago for a missed abortion.  Subjective: Patient reports marked improvement in her preop symptoms. Eating a regular diet without difficulty. The patient is not having any pain.  Activity: normal activities of daily living.  Vaginal bleeding is minimal. She denies fevers, chills, nausea, emesis, abdominal pain.    Objective: Vitals:   02/18/18 1323  BP: 124/82  Pulse: 89  SpO2: 97%   Vital Signs: BP 124/82 (BP Location: Left Arm, Patient Position: Sitting, Cuff Size: Normal)   Pulse 89   Ht 5\' 7"  (1.702 m)   Wt 201 lb (91.2 kg)   LMP 11/07/2017 (Exact Date)   SpO2 97%   BMI 31.48 kg/m  Constitutional: Well nourished, well developed female in no acute distress.  HEENT: normal Skin: Warm and dry.  Extremity: no edema  Abdomen: Soft, non-tender, normal bowel sounds; no bruits, organomegaly or masses.  Assessment: 32 y.o. s/p suction, D&C for missed abortion progressing well  Plan: Patient has done well after surgery with no apparent complications.  I have discussed the post-operative course to date, and the expected progress moving forward.  The patient understands what complications to be concerned about.  I will see the patient in routine follow up, or sooner if needed.    Activity plan: No restriction.  Prentice Docker, MD 02/18/2018, 1:31 PM

## 2018-04-28 ENCOUNTER — Ambulatory Visit: Payer: Self-pay | Admitting: Internal Medicine

## 2018-11-06 ENCOUNTER — Encounter (HOSPITAL_COMMUNITY): Payer: Self-pay

## 2018-11-16 ENCOUNTER — Other Ambulatory Visit: Payer: Self-pay | Admitting: Obstetrics and Gynecology

## 2018-11-16 DIAGNOSIS — Z3A01 Less than 8 weeks gestation of pregnancy: Secondary | ICD-10-CM

## 2018-11-16 NOTE — Telephone Encounter (Signed)
Advise

## 2019-03-16 ENCOUNTER — Other Ambulatory Visit: Payer: Self-pay | Admitting: Maternal Newborn

## 2019-03-16 DIAGNOSIS — F329 Major depressive disorder, single episode, unspecified: Secondary | ICD-10-CM

## 2019-03-16 DIAGNOSIS — O99340 Other mental disorders complicating pregnancy, unspecified trimester: Secondary | ICD-10-CM

## 2019-03-17 NOTE — Telephone Encounter (Signed)
Needs to be seen by provider for additional refills.

## 2019-03-17 NOTE — Telephone Encounter (Signed)
Tried calling pt to let her know - recording said 'she is not available, try again later'.

## 2019-03-17 NOTE — Telephone Encounter (Signed)
Tried called; "wireless customer is not available".  Will try MyChart also.

## 2019-05-25 ENCOUNTER — Encounter: Payer: Self-pay | Admitting: Psychiatry

## 2019-05-25 ENCOUNTER — Other Ambulatory Visit: Payer: Self-pay

## 2019-05-25 ENCOUNTER — Ambulatory Visit (INDEPENDENT_AMBULATORY_CARE_PROVIDER_SITE_OTHER): Payer: Medicaid Other | Admitting: Psychiatry

## 2019-05-25 DIAGNOSIS — F331 Major depressive disorder, recurrent, moderate: Secondary | ICD-10-CM | POA: Diagnosis not present

## 2019-05-25 MED ORDER — BUPROPION HCL ER (XL) 150 MG PO TB24: 150.0000 mg | ORAL_TABLET | ORAL | 1 refills | Status: DC
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
## 2019-06-20 NOTE — Telephone Encounter (Signed)
This has been going on since August with no response from pt.  Message closed.

## 2019-06-26 ENCOUNTER — Other Ambulatory Visit: Payer: Self-pay

## 2019-06-26 ENCOUNTER — Ambulatory Visit (INDEPENDENT_AMBULATORY_CARE_PROVIDER_SITE_OTHER): Payer: Medicaid Other | Admitting: Psychiatry

## 2019-06-26 ENCOUNTER — Encounter: Payer: Self-pay | Admitting: Psychiatry

## 2019-06-26 DIAGNOSIS — F3342 Major depressive disorder, recurrent, in full remission: Secondary | ICD-10-CM

## 2019-06-26 DIAGNOSIS — F331 Major depressive disorder, recurrent, moderate: Secondary | ICD-10-CM

## 2019-06-26 MED ORDER — BUPROPION HCL ER (XL) 150 MG PO TB24: 150.0000 mg | ORAL_TABLET | ORAL | 2 refills | Status: DC
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
## 2019-09-20 ENCOUNTER — Other Ambulatory Visit: Payer: Self-pay

## 2019-09-20 ENCOUNTER — Encounter: Payer: Self-pay | Admitting: Psychiatry

## 2019-09-20 ENCOUNTER — Ambulatory Visit (INDEPENDENT_AMBULATORY_CARE_PROVIDER_SITE_OTHER): Payer: Medicaid Other | Admitting: Psychiatry

## 2019-09-20 DIAGNOSIS — F3342 Major depressive disorder, recurrent, in full remission: Secondary | ICD-10-CM | POA: Diagnosis not present

## 2019-09-20 MED ORDER — BUPROPION HCL ER (XL) 150 MG PO TB24: 150.0000 mg | ORAL_TABLET | ORAL | 2 refills | Status: AC

## 2019-09-20 MED ORDER — TRAZODONE HCL 50 MG PO TABS: 50.0000 mg | ORAL_TABLET | Freq: Every evening | ORAL | 2 refills | Status: AC | PRN
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
## 2019-12-14 ENCOUNTER — Telehealth (INDEPENDENT_AMBULATORY_CARE_PROVIDER_SITE_OTHER): Payer: Medicaid Other | Admitting: Psychiatry

## 2019-12-14 ENCOUNTER — Other Ambulatory Visit: Payer: Self-pay

## 2019-12-14 ENCOUNTER — Encounter: Payer: Self-pay | Admitting: Psychiatry

## 2019-12-14 DIAGNOSIS — F3342 Major depressive disorder, recurrent, in full remission: Secondary | ICD-10-CM | POA: Diagnosis not present
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
## 2020-10-01 ENCOUNTER — Ambulatory Visit: Payer: Medicaid Other | Admitting: Internal Medicine
